# Patient Record
Sex: Male | Born: 1950 | Race: White | Hispanic: No | Marital: Married | Smoking: Former smoker
Health system: Southern US, Community
[De-identification: ages and names within clinical notes are randomized; demographics above are authoritative.]

## PROBLEM LIST (undated history)

## (undated) DIAGNOSIS — I480 Paroxysmal atrial fibrillation: Secondary | ICD-10-CM

## (undated) DIAGNOSIS — Z8619 Personal history of other infectious and parasitic diseases: Secondary | ICD-10-CM

## (undated) DIAGNOSIS — I10 Essential (primary) hypertension: Secondary | ICD-10-CM

## (undated) DIAGNOSIS — E785 Hyperlipidemia, unspecified: Secondary | ICD-10-CM

## (undated) DIAGNOSIS — I255 Ischemic cardiomyopathy: Secondary | ICD-10-CM

## (undated) DIAGNOSIS — I2510 Atherosclerotic heart disease of native coronary artery without angina pectoris: Secondary | ICD-10-CM

## (undated) HISTORY — DX: Hyperlipidemia, unspecified: E78.5

## (undated) HISTORY — PX: CATARACT EXTRACTION: SUR2

## (undated) HISTORY — DX: Morbid (severe) obesity due to excess calories: E66.01

## (undated) HISTORY — DX: Paroxysmal atrial fibrillation: I48.0

## (undated) HISTORY — DX: Essential (primary) hypertension: I10

## (undated) HISTORY — DX: Personal history of other infectious and parasitic diseases: Z86.19

## (undated) HISTORY — PX: OTHER SURGICAL HISTORY: SHX169

## (undated) HISTORY — DX: Ischemic cardiomyopathy: I25.5

## (undated) HISTORY — DX: Atherosclerotic heart disease of native coronary artery without angina pectoris: I25.10

## (undated) HISTORY — PX: FINGER SURGERY: SHX640

## (undated) HISTORY — PX: CORONARY ANGIOPLASTY: SHX604

---

## 2014-06-21 ENCOUNTER — Ambulatory Visit: Payer: Self-pay | Admitting: Unknown Physician Specialty

## 2014-06-21 HISTORY — PX: COLONOSCOPY: SHX174

## 2014-06-22 LAB — PATHOLOGY REPORT

## 2014-08-31 LAB — BASIC METABOLIC PANEL
BUN: 16 mg/dL (ref 4–21)
CREATININE: 1.1 mg/dL (ref 0.6–1.3)
GLUCOSE: 87 mg/dL
POTASSIUM: 4.7 mmol/L (ref 3.4–5.3)
Sodium: 141 mmol/L (ref 137–147)

## 2014-08-31 LAB — TSH: TSH: 1.47 u[IU]/mL (ref 0.41–5.90)

## 2015-10-26 ENCOUNTER — Encounter: Payer: Self-pay | Admitting: Emergency Medicine

## 2015-10-26 ENCOUNTER — Encounter
Admission: EM | Disposition: A | Discharge status: Home / Self Care | Payer: 59 | Source: Home / Self Care | Attending: Cardiovascular Disease

## 2015-10-26 ENCOUNTER — Inpatient Hospital Stay
Admission: EM | Admit: 2015-10-26 | Discharge: 2015-10-29 | DRG: 246 | Disposition: A | Discharge status: Home / Self Care | Payer: 59 | Attending: Cardiovascular Disease | Admitting: Cardiovascular Disease

## 2015-10-26 DIAGNOSIS — I255 Ischemic cardiomyopathy: Secondary | ICD-10-CM | POA: Diagnosis present

## 2015-10-26 DIAGNOSIS — I2119 ST elevation (STEMI) myocardial infarction involving other coronary artery of inferior wall: Principal | ICD-10-CM | POA: Diagnosis present

## 2015-10-26 DIAGNOSIS — I959 Hypotension, unspecified: Secondary | ICD-10-CM | POA: Diagnosis present

## 2015-10-26 DIAGNOSIS — I4891 Unspecified atrial fibrillation: Secondary | ICD-10-CM | POA: Diagnosis present

## 2015-10-26 DIAGNOSIS — I4901 Ventricular fibrillation: Secondary | ICD-10-CM | POA: Diagnosis present

## 2015-10-26 DIAGNOSIS — I2510 Atherosclerotic heart disease of native coronary artery without angina pectoris: Secondary | ICD-10-CM | POA: Diagnosis present

## 2015-10-26 DIAGNOSIS — F17210 Nicotine dependence, cigarettes, uncomplicated: Secondary | ICD-10-CM | POA: Diagnosis present

## 2015-10-26 DIAGNOSIS — I2121 ST elevation (STEMI) myocardial infarction involving left circumflex coronary artery: Secondary | ICD-10-CM

## 2015-10-26 DIAGNOSIS — Z7982 Long term (current) use of aspirin: Secondary | ICD-10-CM | POA: Diagnosis not present

## 2015-10-26 DIAGNOSIS — E8770 Fluid overload, unspecified: Secondary | ICD-10-CM | POA: Diagnosis present

## 2015-10-26 DIAGNOSIS — I2129 ST elevation (STEMI) myocardial infarction involving other sites: Secondary | ICD-10-CM

## 2015-10-26 DIAGNOSIS — E785 Hyperlipidemia, unspecified: Secondary | ICD-10-CM | POA: Diagnosis present

## 2015-10-26 DIAGNOSIS — E878 Other disorders of electrolyte and fluid balance, not elsewhere classified: Secondary | ICD-10-CM | POA: Diagnosis present

## 2015-10-26 DIAGNOSIS — J9690 Respiratory failure, unspecified, unspecified whether with hypoxia or hypercapnia: Secondary | ICD-10-CM

## 2015-10-26 DIAGNOSIS — Z79899 Other long term (current) drug therapy: Secondary | ICD-10-CM | POA: Diagnosis not present

## 2015-10-26 DIAGNOSIS — R079 Chest pain, unspecified: Secondary | ICD-10-CM | POA: Diagnosis not present

## 2015-10-26 DIAGNOSIS — I213 ST elevation (STEMI) myocardial infarction of unspecified site: Secondary | ICD-10-CM | POA: Insufficient documentation

## 2015-10-26 HISTORY — PX: CARDIAC CATHETERIZATION: SHX172

## 2015-10-26 LAB — BASIC METABOLIC PANEL
Anion gap: 9 (ref 5–15)
BUN: 21 mg/dL — AB (ref 6–20)
CHLORIDE: 105 mmol/L (ref 101–111)
CO2: 23 mmol/L (ref 22–32)
Calcium: 9.2 mg/dL (ref 8.9–10.3)
Creatinine, Ser: 0.8 mg/dL (ref 0.61–1.24)
GFR calc Af Amer: >60 mL/min (ref >60)
GFR calc non Af Amer: >60 mL/min (ref >60)
GLUCOSE: 125 mg/dL — AB (ref 65–99)
POTASSIUM: 3.4 mmol/L — AB (ref 3.5–5.1)
Sodium: 137 mmol/L (ref 135–145)

## 2015-10-26 LAB — HEPATIC FUNCTION PANEL
ALK PHOS: 72 U/L (ref 38–126)
ALT: 39 U/L (ref 17–63)
AST: 40 U/L (ref 15–41)
Albumin: 3.8 g/dL (ref 3.5–5.0)
BILIRUBIN TOTAL: 0.6 mg/dL (ref 0.3–1.2)
Total Protein: 6.1 g/dL — ABNORMAL LOW (ref 6.5–8.1)

## 2015-10-26 LAB — CBC
HCT: 42.8 % (ref 40.0–52.0)
HEMOGLOBIN: 15 g/dL (ref 13.0–18.0)
MCH: 31.5 pg (ref 26.0–34.0)
MCHC: 35.1 g/dL (ref 32.0–36.0)
MCV: 89.8 fL (ref 80.0–100.0)
PLATELETS: 247 10*3/uL (ref 150–440)
RBC: 4.77 MIL/uL (ref 4.40–5.90)
RDW: 13.1 % (ref 11.5–14.5)
WBC: 11 10*3/uL — AB (ref 3.8–10.6)

## 2015-10-26 LAB — TROPONIN I: TROPONIN I: 1.08 ng/mL — AB (ref <0.031)

## 2015-10-26 SURGERY — LEFT HEART CATH AND CORONARY ANGIOGRAPHY
Anesthesia: Moderate Sedation

## 2015-10-26 MED ORDER — SODIUM CHLORIDE 0.9 % IV SOLN: 250.0000 mL | INTRAVENOUS | Status: DC | PRN

## 2015-10-26 MED ORDER — METOPROLOL TARTRATE 1 MG/ML IV SOLN
INTRAVENOUS | Status: AC
  Filled 2015-10-26: qty 5

## 2015-10-26 MED ORDER — ROSUVASTATIN CALCIUM 20 MG PO TABS
20.0000 mg | ORAL_TABLET | Freq: Every day | ORAL | Status: DC
  Administered 2015-10-26 – 2015-10-28 (×3): 20 mg via ORAL
  Filled 2015-10-26 (×2): qty 2
  Filled 2015-10-26: qty 1

## 2015-10-26 MED ORDER — MIDAZOLAM HCL 2 MG/2ML IJ SOLN
INTRAMUSCULAR | Status: DC | PRN
  Administered 2015-10-26: 1 mg via INTRAVENOUS

## 2015-10-26 MED ORDER — ENOXAPARIN SODIUM 40 MG/0.4ML ~~LOC~~ SOLN
40.0000 mg | SUBCUTANEOUS | Status: DC
  Administered 2015-10-27 – 2015-10-28 (×2): 40 mg via SUBCUTANEOUS
  Filled 2015-10-26 (×3): qty 0.4

## 2015-10-26 MED ORDER — BIVALIRUDIN 250 MG IV SOLR
INTRAVENOUS | Status: AC
  Filled 2015-10-26: qty 250

## 2015-10-26 MED ORDER — SODIUM CHLORIDE 0.9 % IV SOLN
INTRAVENOUS | Status: DC
  Administered 2015-10-26 – 2015-10-27 (×2): via INTRAVENOUS

## 2015-10-26 MED ORDER — MIDAZOLAM HCL 2 MG/2ML IJ SOLN
INTRAMUSCULAR | Status: AC
  Filled 2015-10-26: qty 2

## 2015-10-26 MED ORDER — ONDANSETRON HCL 4 MG/2ML IJ SOLN: 4.0000 mg | Freq: Four times a day (QID) | INTRAMUSCULAR | Status: DC | PRN

## 2015-10-26 MED ORDER — METOPROLOL TARTRATE 25 MG PO TABS
25.0000 mg | ORAL_TABLET | Freq: Four times a day (QID) | ORAL | Status: DC
  Administered 2015-10-26 – 2015-10-27 (×4): 25 mg via ORAL
  Filled 2015-10-26 (×4): qty 1

## 2015-10-26 MED ORDER — BIVALIRUDIN BOLUS VIA INFUSION - CUPID
INTRAVENOUS | Status: DC | PRN
  Administered 2015-10-26: 85.05 mg via INTRAVENOUS

## 2015-10-26 MED ORDER — BIVALIRUDIN 250 MG IV SOLR
250.0000 mg | INTRAVENOUS | Status: DC | PRN
  Administered 2015-10-26: 1.75 mg/kg/h via INTRAVENOUS

## 2015-10-26 MED ORDER — FENTANYL CITRATE (PF) 100 MCG/2ML IJ SOLN
INTRAMUSCULAR | Status: AC
  Filled 2015-10-26: qty 2

## 2015-10-26 MED ORDER — METOPROLOL TARTRATE 1 MG/ML IV SOLN: 2.5000 mg | Freq: Four times a day (QID) | INTRAVENOUS | Status: DC | PRN

## 2015-10-26 MED ORDER — NITROGLYCERIN 5 MG/ML IV SOLN
INTRAVENOUS | Status: AC
  Filled 2015-10-26: qty 10

## 2015-10-26 MED ORDER — METOPROLOL TARTRATE 1 MG/ML IV SOLN
5.0000 mg | Freq: Once | INTRAVENOUS | Status: AC
  Administered 2015-10-26: 5 mg via INTRAVENOUS
  Filled 2015-10-26: qty 5

## 2015-10-26 MED ORDER — ASPIRIN EC 81 MG PO TBEC
81.0000 mg | DELAYED_RELEASE_TABLET | Freq: Every day | ORAL | Status: DC
  Administered 2015-10-27 – 2015-10-29 (×3): 81 mg via ORAL
  Filled 2015-10-26 (×3): qty 1

## 2015-10-26 MED ORDER — LISINOPRIL 5 MG PO TABS
5.0000 mg | ORAL_TABLET | Freq: Every day | ORAL | Status: DC
  Administered 2015-10-26 – 2015-10-29 (×4): 5 mg via ORAL
  Filled 2015-10-26 (×4): qty 1
  Filled 2015-10-26: qty 2

## 2015-10-26 MED ORDER — TICAGRELOR 90 MG PO TABS
ORAL_TABLET | ORAL | Status: DC | PRN
  Administered 2015-10-26: 180 mg via ORAL

## 2015-10-26 MED ORDER — TICAGRELOR 90 MG PO TABS
90.0000 mg | ORAL_TABLET | Freq: Two times a day (BID) | ORAL | Status: DC
  Administered 2015-10-26 – 2015-10-29 (×6): 90 mg via ORAL
  Filled 2015-10-26 (×6): qty 1

## 2015-10-26 MED ORDER — FENTANYL CITRATE (PF) 100 MCG/2ML IJ SOLN
INTRAMUSCULAR | Status: DC | PRN
  Administered 2015-10-26: 50 ug via INTRAVENOUS

## 2015-10-26 MED ORDER — IOHEXOL 300 MG/ML  SOLN
INTRAMUSCULAR | Status: DC | PRN
  Administered 2015-10-26: 230 mL via INTRA_ARTERIAL

## 2015-10-26 MED ORDER — TICAGRELOR 90 MG PO TABS
ORAL_TABLET | ORAL | Status: AC
  Filled 2015-10-26: qty 2

## 2015-10-26 MED ORDER — METOPROLOL TARTRATE 1 MG/ML IV SOLN
INTRAVENOUS | Status: DC | PRN
  Administered 2015-10-26: 5 mg via INTRAVENOUS

## 2015-10-26 MED ORDER — MAGNESIUM SULFATE 50 % IJ SOLN
INTRAMUSCULAR | Status: AC | PRN
  Administered 2015-10-26: 2 g via INTRAVENOUS

## 2015-10-26 MED ORDER — SODIUM CHLORIDE 0.9 % IJ SOLN: 3.0000 mL | INTRAMUSCULAR | Status: DC | PRN

## 2015-10-26 MED ORDER — HYDROCODONE-ACETAMINOPHEN 5-325 MG PO TABS
1.0000 | ORAL_TABLET | Freq: Once | ORAL | Status: AC
  Administered 2015-10-27: 1 via ORAL
  Filled 2015-10-26: qty 1

## 2015-10-26 MED ORDER — SODIUM CHLORIDE 0.9 % IJ SOLN
3.0000 mL | Freq: Two times a day (BID) | INTRAMUSCULAR | Status: DC
  Administered 2015-10-26 – 2015-10-28 (×4): 3 mL via INTRAVENOUS

## 2015-10-26 MED ORDER — NITROGLYCERIN 1 MG/10 ML FOR IR/CATH LAB
INTRA_ARTERIAL | Status: DC | PRN
  Administered 2015-10-26: 300 ug via INTRACORONARY

## 2015-10-26 MED ORDER — HEPARIN (PORCINE) IN NACL 2-0.9 UNIT/ML-% IJ SOLN
INTRAMUSCULAR | Status: AC
  Filled 2015-10-26: qty 1000

## 2015-10-26 MED ORDER — ACETAMINOPHEN 325 MG PO TABS
650.0000 mg | ORAL_TABLET | ORAL | Status: DC | PRN
  Administered 2015-10-26 – 2015-10-27 (×2): 650 mg via ORAL
  Filled 2015-10-26 (×2): qty 2

## 2015-10-26 MED ORDER — CALCIUM CHLORIDE 10 % IV SOLN
INTRAVENOUS | Status: AC | PRN
Start: 2015-10-26 — End: 2015-10-26
  Administered 2015-10-26: 1 g via INTRAVENOUS

## 2015-10-26 MED ORDER — POTASSIUM CHLORIDE CRYS ER 20 MEQ PO TBCR
40.0000 meq | EXTENDED_RELEASE_TABLET | Freq: Once | ORAL | Status: AC
  Administered 2015-10-26: 40 meq via ORAL
  Filled 2015-10-26: qty 2

## 2015-10-26 MED ORDER — METOPROLOL TARTRATE 1 MG/ML IV SOLN
INTRAVENOUS | Status: DC | PRN
  Administered 2015-10-26: 2.5 mg via INTRAVENOUS

## 2015-10-26 MED ORDER — MAGNESIUM SULFATE 2 GM/50ML IV SOLN
INTRAVENOUS | Status: AC
  Administered 2015-10-26: 16:00:00
  Filled 2015-10-26: qty 50

## 2015-10-26 SURGICAL SUPPLY — 17 items
BALLN TREK RX 2.5X12 (BALLOONS) ×3
BALLOON TREK RX 2.5X12 (BALLOONS) ×1 IMPLANT
CATH INFINITI 5FR ANG PIGTAIL (CATHETERS) ×3 IMPLANT
CATH INFINITI 5FR JL4 (CATHETERS) ×3 IMPLANT
CATH INFINITI JR4 5F (CATHETERS) IMPLANT
CATH OPTITORQUE JACKY 4.0 5F (CATHETERS) IMPLANT
CATH VISTA GUIDE 6FR JR4 (CATHETERS) ×3 IMPLANT
CATH VISTA GUIDE 6FR XB3.5 (CATHETERS) ×3 IMPLANT
DEVICE CLOSURE MYNXGRIP 6/7F (Vascular Products) ×3 IMPLANT
GLIDESHEATH SLEND SS 6F .021 (SHEATH) IMPLANT
KIT MANI 3VAL PERCEP (MISCELLANEOUS) ×3 IMPLANT
NEEDLE PERC 18GX7CM (NEEDLE) ×3 IMPLANT
PACK CARDIAC CATH (CUSTOM PROCEDURE TRAY) ×3 IMPLANT
SHEATH PINNACLE 6F 10CM (SHEATH) ×3 IMPLANT
STENT XIENCE ALPINE RX 3.0X15 (Permanent Stent) ×3 IMPLANT
WIRE RUNTHROUGH .014X180CM (WIRE) ×3 IMPLANT
WIRE SAFE-T 1.5MM-J .035X260CM (WIRE) IMPLANT

## 2015-10-26 NOTE — ED Provider Notes (Signed)
[facility] Emergency Department Provider Note  ____________________________________________  Time seen: Approximately 3:20 PM  I have reviewed the triage vital signs and the nursing notes.   HISTORY  Chief Complaint STEMI     HPI Gilbert Coleman is a 64 y.o. male with no significant past medical history other than tobacco use who presents by EMS as a code STEMI.  He reports that he had some left-sided chest pain, shortness breath, and left arm pain at approximately 10:00 this morning but it got better after he sat down and took a few deep breaths.  He was doing some work outside this afternoon when he had acute onset of severe crushing chest pain, shortness of breath, and lightheadedness.  Nothing made it better nothing made it worse.  He called EMS and when they arrived they found that he had a STEMI of the inferior wall based on their EKG.  They were transporting him to [facility] when he had a V. fib arrest at 15:17.  He was defibrillated once and immediately regained consciousness and began speaking again.  He did get a full dose aspirin prior to arrival.  Arrived to our emergency Department at approximately 1520 and was awake and alert on a nonrebreather 100% oxygen and complaining of mild chest pain but eating that it had improved.  However while we were getting him settled in he said "I feel coming on again" and then again went into V. fib arrest.  Please refer to Hospital course below for the remainder of the events.   History reviewed. No pertinent past medical history.  There are no active problems to display for this patient.   History reviewed. No pertinent past surgical history.  No current outpatient prescriptions on file.  Allergies Atorvastatin  History reviewed. No pertinent family history.  Social History Social History  Substance Use Topics  . Smoking status: Current Some Day Smoker  . Smokeless tobacco: None  . Alcohol Use: None     Review of Systems Constitutional: No fever/chills Eyes: No visual changes. ENT: No sore throat. Cardiovascular: Severe sharp stabbing crushing chest pain Respiratory: Severe shortness of breath Gastrointestinal: No abdominal pain.  No nausea, no vomiting.  No diarrhea.  No constipation. Genitourinary: Negative for dysuria. Musculoskeletal: Negative for back pain. Skin: Negative for rash. Neurological: Negative for headaches, focal weakness or numbness.  10-point ROS otherwise negative.  ____________________________________________   PHYSICAL EXAM:  VITAL SIGNS: ED Triage Vitals  Enc Vitals Group     BP 10/26/15 1529 0/0 mmHg     Pulse Rate 10/26/15 1529 149     Resp 10/26/15 1530 15     Temp 10/26/15 1534 97.8 F (36.6 C)     Temp Source 10/26/15 1534 Oral     SpO2 10/26/15 1530 96 %     Weight --      Height --      Head Cir --      Peak Flow --      Pain Score --      Pain Loc --      Pain Edu? --      Excl. in [place]? --     Constitutional: Alert and oriented on arrival, ill-appearing Eyes: Conjunctivae are normal. PERRL. EOMI. Head: Atraumatic. Nose: No congestion/rhinnorhea. Mouth/Throat: Mucous membranes are moist.  Oropharynx non-erythematous. Neck: No stridor.   Cardiovascular: Tachycardia, regular rhythm. Grossly normal heart sounds.  Good peripheral circulation. Respiratory: Normal respiratory effort.  No retractions. Lungs CTAB. Gastrointestinal:  Soft and nontender. No distention. No abdominal bruits. No CVA tenderness. Musculoskeletal: No lower extremity tenderness nor edema.  No joint effusions. Neurologic:  Normal speech and language. No gross focal neurologic deficits are appreciated.  Skin:  Skin is warm, dry and intact. No rash noted. Psychiatric: Mood and affect are normal. Speech and behavior are normal.  ____________________________________________   LABS (all labs ordered are listed, but only abnormal results are displayed)  Labs  Reviewed - No data to display ____________________________________________  EKG  ED ECG REPORT I, Ruhama Lehew, the attending physician, personally viewed and interpreted this ECG.   Date: 10/26/2015  EKG Time: 1527  Rate: 136  Rhythm: ST elevation in inferior leads with reciprocal depression in septal and anterior leads, a easily meets criteria for STEMI  Axis: Normal  Intervals:Normal  ST&T Change: Large inferior wall and lateral wall STEMI  ____________________________________________  RADIOLOGY   No results found.  ____________________________________________   PROCEDURES  Procedure(s) performed: None  Critical Care performed: Yes, see critical care note(s)   CRITICAL CARE Performed by: Loleta Rose   Total critical care time: 30 minutes  Critical care time was exclusive of separately billable procedures and treating other patients.  Critical care was necessary to treat or prevent imminent or life-threatening deterioration.  Critical care was time spent personally by me on the following activities: development of treatment plan with patient and/or surrogate as well as nursing, discussions with consultants, evaluation of patient's response to treatment, examination of patient, obtaining history from patient or surrogate, ordering and performing treatments and interventions, ordering and review of laboratory studies, ordering and review of radiographic studies, pulse oximetry and re-evaluation of patient's condition.  ____________________________________________   INITIAL IMPRESSION / ASSESSMENT AND PLAN / ED COURSE  Pertinent labs & imaging results that were available during my care of the patient were reviewed by me and considered in my medical decision making (see chart for details).  As stated above, the patient arrived awake and alert and as we are getting him settled in to his exam room he went into V. fib arrest again.  We placed anterior and posterior  chest pads and I defibrillated him at 200 J.  He immediately went back to a sinus tachycardia on the monitor and regained consciousness and was again awake and alert.  My colleague was in the process of contacting the on-call cardiologist, Dr. Kirke Corin, while I was stabilizing the patient, and Dr. Kirke Corin arrived within 2 minutes to the emergency department.  As Dr. [place] Sink arrives the patient again went into V. fib arrest.  This pattern repeated for a total of 4 defibrillations before he stayed in a stable sinus tachycardia.  At no point were chest compressions necessary.  In the emergency department the patient received heparin 4000 units IV bolus, calcium chloride 1 g IV, magnesium 2 g IV, and he received a full dose aspirin prior to arrival.  He was again awake and alert when Dr. Kirke Corin took him to the catheter lab.  EKG documented was from EMS, it was clear and met criteria for STEMI and we did not have the opportunity to obtain another EKG in the emergency department due to his frequent V. fib arrests and the need to get him to the catheter lab as soon as possible.  Dr. Kirke Corin reviewed the rhythm strips and 12-lead from EMS and agreed with the diagnosis. ____________________________________________  FINAL CLINICAL IMPRESSION(S) / ED DIAGNOSES  Final diagnoses:  Acute ST elevation myocardial infarction (STEMI) involving other  coronary artery of inferior wall (HCC)  Acute ST elevation myocardial infarction (STEMI) of lateral wall [facility])      NEW MEDICATIONS STARTED DURING THIS VISIT:  Current Discharge Medication List       Hinda Kehr, MD 10/26/15 [phone]

## 2015-10-26 NOTE — ED Notes (Signed)
Patient arrived to this facility alert and oriented, pulses present following a vfib arrest with EMS that occurred at 1517.  When patient arrived, he had another vfib arrest and was immediately defibrillated successfully with ROSC.  Patient was given 2 grams magnesium, 1 gram calcium chloride and arrested a 2nd, 3rd, and 4th times, each time successfully converting a vfib rhythm to sinus tach with obvious ST segment elevation in inferior leads. Dr. Kary KosArita arrived, pt was given 4000 units of heparin and transported immediately to cath lab with cardiology, 2 nurses, and 1 paramedic with him while connected to monitor and defibrillator.  When he was transferred to cath lab, he was alert and oriented, talking.

## 2015-10-26 NOTE — ED Notes (Signed)
4000 units heparin given 

## 2015-10-26 NOTE — H&P (Signed)
History and Physical  Patient ID: Gilbert Coleman MRN: [id number] DOB/AGE: 1951-03-01 64 y.o. Admit date: 10/26/2015  Primary Care Physician: No primary care provider on file. Primary Cardiologist new   Chief complaint: Chest pain.  HPI: This is a 64 year old male with no previous cardiac history. He has known history of hyperlipidemia but no history of hypertension or diabetes. He smokes very rarely not on a regular basis. He presented with acute onset of chest pain. This started at 10:30 in the morning while he was shopping. He rested and the chest pain disappeared. He went back home and around 2:30, he started having severe chest pain radiating to his left arm associated with extreme weakness, dizziness and shortness of breath. He did not have nausea or vomiting. He called EMS. EKG in the field showed inferolateral ST elevation. While he was being transported, he developed ventricular fibrillation and was shocked once into sinus tachycardia. He was brought to the emergency room with active chest pain and recurrent ventricular fibrillation. He required defibrillation at least 4 times. He was given 4000 units of unfractionated heparin in addition to aspirin and was transferred emergently for cardiac catheterization.  A 10 point Review of systems complete and found to be negative unless listed above   History reviewed. No pertinent past medical history.  History reviewed.   Family history: He has known family history of coronary artery disease. His brothers had stent placement. His father had valve surgery.  Social History   Social History  . Marital Status: Married    Spouse Name: N/A  . Number of Children: N/A  . Years of Education: N/A   Occupational History  . Not on file.   Social History Main Topics  . Smoking status: Current Some Day Smoker  . Smokeless tobacco: Not on file  . Alcohol Use: Not on file  . Drug Use: Not on file  . Sexual Activity: Not on file   Other  Topics Concern  . Not on file   Social History Narrative  . No narrative on file    History reviewed. No pertinent past surgical history.   Prescriptions prior to admission  Medication Sig Dispense Refill Last Dose  . aspirin 81 MG tablet Take 1 tablet by mouth daily.     . Misc Natural Products (GLUCOSAMINE CHONDROITIN COMPLX) TABS Take 1 tablet by mouth daily.     . Multiple Vitamin (MULTIVITAMIN WITH MINERALS) TABS tablet Take 1 tablet by mouth daily.     Marland Kitchen omega-3 fish oil (MAXEPA) 1000 MG CAPS capsule Take 1 capsule by mouth 2 (two) times daily.       Physical Exam: Blood pressure 119/79, pulse 87, temperature 97.8 F (36.6 C), temperature source Oral, resp. rate 14, SpO2 97 %.   Constitutional: He is oriented to person, place, and time. He appears well-developed and well-nourished. No distress.  HENT: No nasal discharge.  Head: Normocephalic and atraumatic.  Eyes: Pupils are equal and round.  No discharge. Neck: Normal range of motion. Neck supple. No JVD present. No thyromegaly present.  Cardiovascular: Normal rate, regular rhythm, normal heart sounds. Exam reveals no gallop and no friction rub. No murmur heard.  Pulmonary/Chest: Effort normal and breath sounds normal. No stridor. No respiratory distress. He has no wheezes. He has no rales. He exhibits no tenderness.  Abdominal: Soft. Bowel sounds are normal. He exhibits no distension. There is no tenderness. There is no rebound and no guarding.  Musculoskeletal: Normal range of motion. He exhibits  no edema and no tenderness.  Neurological: He is alert and oriented to person, place, and time. Coordination normal.  Skin: Skin is warm and dry. No rash noted. He is not diaphoretic. No erythema. No pallor.  Psychiatric: He has a normal mood and affect. His behavior is normal. Judgment and thought content normal.      Labs:   Lab Results  Component Value Date   WBC 11.0* 10/26/2015   HGB 15.0 10/26/2015   HCT 42.8  10/26/2015   MCV 89.8 10/26/2015   PLT 247 10/26/2015   No results for input(s): NA, K, CL, CO2, BUN, CREATININE, CALCIUM, PROT, BILITOT, ALKPHOS, ALT, AST, GLUCOSE in the last 168 hours.  Invalid input(s): LABALBU No results found for: CKTOTAL, CKMB, CKMBINDEX, TROPONINI No results found for: CHOL No results found for: HDL No results found for: LDLCALC No results found for: TRIG No results found for: CHOLHDL No results found for: LDLDIRECT    Radiology: No results found.  EKG:  EKG was independently reviewed by me and showed   inferolateral ST elevation with reciprocal anterior ST depression and sinus tachycardia.  ASSESSMENT AND PLAN:  1. Acute inferolateral ST elevation myocardial infarction: The patient was given aspirin and heparin and was taken emergently for cardiac catheterization after I discussed the procedure briefly with him. This was an emergent procedure and the patient was actively having ventricular fibrillation at that time of initial evaluation. Further recommendations to follow after cardiac catheterization.  2. Ventricular fibrillation: Likely due to coronary ischemia. The patient was given magnesium. We'll start a beta blocker. Check ejection fraction.  3. Hyperlipidemia: Start high-dose statin.  4. Rare tobacco use: Smoking cessation is advised.   Signed: Lorine BearsMuhammad Arida MD, [facility] 10/26/2015, 5:02 PM

## 2015-10-26 NOTE — ED Notes (Signed)
Transported to cath lab with Dr. Isaias SakaiArita, Amanda, RN; This nurse, and a nurse tech.

## 2015-10-26 NOTE — Progress Notes (Signed)
Pt is stable at this time. Site has minimal drainage, which is marked. Pt is alert and oriented x4, wife is at bedside. Report given to oncoming night nurse.

## 2015-10-26 NOTE — Progress Notes (Signed)
Very nice couple.  Gave compassionate presence and emotional support. Chaplain Ronney Liononna S Rishith Siddoway Ext [phone]

## 2015-10-26 NOTE — Progress Notes (Signed)
eLink Physician-Brief Progress Note Patient Name: Gilbert CrawfordRobert D Coleman DOB: 03/28/1951 MRN: [id number]   Date of Service  10/26/2015  HPI/Events of Note   bedside nurse reports patient having chest wall pain 2/10. On repeat questioning he may be experiencing some chest "pressure" but still reports this is different from his prior MI from earlier. Additionally the patient has been mildly hypertensive. Of note the patient did undergo defibrillation earlier today.   eICU Interventions  1.  metoprolol 2.5 mg IV every 6 hours when necessary for hypertension  2. Lortab 5/325 one tab now.  3. Nurse to notify for any persistent or worsening chest pain.      Intervention Category Intermediate Interventions: Other:  Lawanda CousinsJennings Nestor 10/26/2015, 11:27 PM

## 2015-10-26 NOTE — ED Notes (Signed)
Patient received 2 gram magnesium, 1 gram calcium chloride, 4000 units heparin while here.

## 2015-10-26 NOTE — ED Notes (Signed)
Per EMS:  STEMI:  cp 15 minutes' inferior stemi road to [place] went into vfib, defibrillated and had rosc. arrested at 1519.  now alert and oriented.  1 ntg, 324 asa, 20 right hand.  160/palpated.    Natalie Luberta Robertsondaniel Bicknell  1523 MD at bedside  currently cp rated as "better now, im talking to you" smoker, no prev cardiac hx   EMS Vital Signs: 160/98, pale diaphoretic, 90% ra, 105 ST elevation inferior leads,   1525 vfib arrested, agonal respirations.   1526 defibrillating. ST now with ST seg elevation

## 2015-10-26 NOTE — ED Notes (Signed)
First arrest at 1517, defibrillated once with ems.  4 times here. Currently ST pulses present.

## 2015-10-27 ENCOUNTER — Inpatient Hospital Stay: Payer: 59

## 2015-10-27 ENCOUNTER — Inpatient Hospital Stay (HOSPITAL_COMMUNITY)
Admit: 2015-10-27 | Discharge: 2015-10-27 | Disposition: A | Discharge status: Home / Self Care | Payer: 59 | Attending: Cardiovascular Disease | Admitting: Cardiovascular Disease

## 2015-10-27 DIAGNOSIS — I2119 ST elevation (STEMI) myocardial infarction involving other coronary artery of inferior wall: Principal | ICD-10-CM

## 2015-10-27 DIAGNOSIS — R079 Chest pain, unspecified: Secondary | ICD-10-CM

## 2015-10-27 LAB — CBC
HCT: 49.2 % (ref 40.0–52.0)
Hemoglobin: 17 g/dL (ref 13.0–18.0)
MCH: 31.6 pg (ref 26.0–34.0)
MCHC: 34.5 g/dL (ref 32.0–36.0)
MCV: 91.7 fL (ref 80.0–100.0)
PLATELETS: 296 10*3/uL (ref 150–440)
RBC: 5.36 MIL/uL (ref 4.40–5.90)
RDW: 13.4 % (ref 11.5–14.5)
WBC: 19.8 10*3/uL — ABNORMAL HIGH (ref 3.8–10.6)

## 2015-10-27 LAB — BASIC METABOLIC PANEL
Anion gap: 7 (ref 5–15)
BUN: 19 mg/dL (ref 6–20)
CHLORIDE: 106 mmol/L (ref 101–111)
CO2: 25 mmol/L (ref 22–32)
CREATININE: 0.79 mg/dL (ref 0.61–1.24)
Calcium: 9.1 mg/dL (ref 8.9–10.3)
GFR calc non Af Amer: >60 mL/min (ref >60)
GLUCOSE: 149 mg/dL — AB (ref 65–99)
Potassium: 4.3 mmol/L (ref 3.5–5.1)
Sodium: 138 mmol/L (ref 135–145)

## 2015-10-27 LAB — POTASSIUM: Potassium: 3.7 mmol/L (ref 3.5–5.1)

## 2015-10-27 LAB — TROPONIN I: Troponin I: 22.18 ng/mL — ABNORMAL HIGH (ref <0.031)

## 2015-10-27 LAB — GLUCOSE, CAPILLARY: Glucose-Capillary: 160 mg/dL — ABNORMAL HIGH (ref 65–99)

## 2015-10-27 MED ORDER — FUROSEMIDE 10 MG/ML IJ SOLN
INTRAMUSCULAR | Status: AC
  Administered 2015-10-27: 40 mg via INTRAVENOUS
  Filled 2015-10-27: qty 4

## 2015-10-27 MED ORDER — ALPRAZOLAM 0.25 MG PO TABS
0.2500 mg | ORAL_TABLET | Freq: Once | ORAL | Status: AC
  Administered 2015-10-27: 0.25 mg via ORAL
  Filled 2015-10-27: qty 1

## 2015-10-27 MED ORDER — FUROSEMIDE 10 MG/ML IJ SOLN
40.0000 mg | Freq: Once | INTRAMUSCULAR | Status: AC
  Administered 2015-10-27: 40 mg via INTRAVENOUS

## 2015-10-27 MED ORDER — ALPRAZOLAM 0.25 MG PO TABS: 0.2500 mg | ORAL_TABLET | Freq: Three times a day (TID) | ORAL | Status: DC | PRN

## 2015-10-27 MED ORDER — NITROGLYCERIN 0.4 MG SL SUBL
0.4000 mg | SUBLINGUAL_TABLET | Freq: Once | SUBLINGUAL | Status: AC
  Administered 2015-10-27: 0.4 mg via SUBLINGUAL
  Filled 2015-10-27: qty 1

## 2015-10-27 MED ORDER — AMIODARONE HCL IN DEXTROSE 360-4.14 MG/200ML-% IV SOLN
30.0000 mg/h | INTRAVENOUS | Status: DC
  Administered 2015-10-27 – 2015-10-28 (×2): 30 mg/h via INTRAVENOUS
  Filled 2015-10-27 (×4): qty 200

## 2015-10-27 MED ORDER — AMIODARONE LOAD VIA INFUSION
150.0000 mg | Freq: Once | INTRAVENOUS | Status: AC
  Administered 2015-10-27: 150 mg via INTRAVENOUS
  Filled 2015-10-27: qty 83.34

## 2015-10-27 MED ORDER — AMIODARONE HCL IN DEXTROSE 360-4.14 MG/200ML-% IV SOLN
60.0000 mg/h | INTRAVENOUS | Status: AC
  Administered 2015-10-27 (×2): 60 mg/h via INTRAVENOUS
  Filled 2015-10-27 (×2): qty 200

## 2015-10-27 NOTE — Progress Notes (Signed)
eLink Physician-Brief Progress Note Patient Name: Gilbert CrawfordRobert D Coleman DOB: 07/29/1951 MRN: [id number]   Date of Service  10/27/2015  HPI/Events of Note  RN reports continuing chest discomfort & dyspnea unable to lay flat. Vitals and Saturation stable.  eICU Interventions  SL Nitro x1 now.     Intervention Category Intermediate Interventions: Other:  Lawanda CousinsJennings Euleta Belson 10/27/2015, 1:49 AM

## 2015-10-27 NOTE — Progress Notes (Signed)
Spoke with Dr. Kirke [name], patient has had SOB and pressure during night, received Lortab, SL Nitro, Tylenol and .25 Xanax. Currently is having Echo. Afib110s, SBP 120.  Per Dr Kirke [name], initiate Amiodarone drip at 1 mg, maintaining at 1 mg, do not titrate at this time, with 150 mg Bolus. Heart healthy diet.

## 2015-10-27 NOTE — Progress Notes (Signed)
SUBJECTIVE:  The patient continued to be in atrial fibrillation with moderate tachycardia overnight. He did not sleep all night and he has been complaining of increased shortness of breath. Minimal chest pain at the present time. He seems to be extremely anxious.   Filed Vitals:   10/27/15 0700 10/27/15 0800 10/27/15 0900 10/27/15 1000  BP: 130/100 129/88 136/98 120/77  Pulse: 117 119 116 111  Temp:  98.6 F (37 C)    TempSrc:  Axillary    Resp: 19 14 22 18   Height:      Weight:      SpO2: 98% 96% 97% 94%    Intake/Output Summary (Last 24 hours) at 10/27/15 1042 Last data filed at 10/27/15 0800  Gross per 24 hour  Intake   2058 ml  Output   1175 ml  Net    883 ml    LABS: Basic Metabolic Panel:  Recent Labs  52/84/1312/23/16 1523 10/27/15 0426  NA 137 138  K 3.4* 4.3  CL 105 106  CO2 23 25  GLUCOSE 125* 149*  BUN 21* 19  CREATININE 0.80 0.79  CALCIUM 9.2 9.1   Liver Function Tests:  Recent Labs  10/26/15 1523  AST 40  ALT 39  ALKPHOS 72  BILITOT 0.6  PROT 6.1*  ALBUMIN 3.8   No results for input(s): LIPASE, AMYLASE in the last 72 hours. CBC:  Recent Labs  10/26/15 1523 10/27/15 0426  WBC 11.0* 19.8*  HGB 15.0 17.0  HCT 42.8 49.2  MCV 89.8 91.7  PLT 247 296   Cardiac Enzymes:  Recent Labs  10/26/15 1523 10/27/15 0426  TROPONINI 1.08* 22.18*   BNP: Invalid input(s): POCBNP D-Dimer: No results for input(s): DDIMER in the last 72 hours. Hemoglobin A1C: No results for input(s): HGBA1C in the last 72 hours. Fasting Lipid Panel: No results for input(s): CHOL, HDL, LDLCALC, TRIG, CHOLHDL, LDLDIRECT in the last 72 hours. Thyroid Function Tests: No results for input(s): TSH, T4TOTAL, T3FREE, THYROIDAB in the last 72 hours.  Invalid input(s): FREET3 Anemia Panel: No results for input(s): VITAMINB12, FOLATE, FERRITIN, TIBC, IRON, RETICCTPCT in the last 72 hours.   PHYSICAL EXAM General: Well developed, well nourished, in no acute  distress HEENT:  Normocephalic and atramatic Neck:  No JVD.  Lungs: Diminished breath sounds at the base. Heart: Irregularly irregular and mildly tachycardic. No murmurs by exam. Abdomen: Bowel sounds are positive, abdomen soft and non-tender  Msk:  Back normal, normal gait. Normal strength and tone for age. Extremities: No clubbing, cyanosis or edema.   Neuro: Alert and oriented X 3. Psych:  Good affect, responds appropriately  TELEMETRY: Reviewed telemetry pt in  atrial fibrillation with rapid ventricular response. Minimal inferior ST elevation when compared to his presenting EKG.  Chest x-ray independently reviewed by me and showed no evidence of infiltrates or pulmonary edema.  ASSESSMENT AND PLAN:  1. Acute inferolateral ST elevation myocardial infarction: No evidence of recurrent ischemia. Continue dual antiplatelet therapy, metoprolol and a high-dose statin.  2. Ventricular fibrillation: Likely due to coronary ischemia. Continue to replace electrolytes as needed. Continue metoprolol.   3. Acute systolic heart failure due to ischemic cardiomyopathy: The patient is having increased shortness of breath with diminished breath sounds at the base. He seems to be mildly fluid overloaded. I discontinued normal saline. I gave 1 dose of furosemide 40 mg intravenously. Continue to monitor closely.  4. Atrial fibrillation with rapid ventricular response: This is likely contributing to his shortness of breath  and thus I decided to add amiodarone drip to try to convert him back to sinus rhythm.  5. Rare tobacco use: Smoking cessation is advised.  Lorine Bears, MD, [facility] 10/27/2015 10:42 AM

## 2015-10-27 NOTE — Progress Notes (Signed)
eLink Physician-Brief Progress Note Patient Name: Gilbert CrawfordRobert D Coleman DOB: 03/20/1951 MRN: [id number]   Date of Service  10/27/2015  HPI/Events of Note  RN reports continued worsening of dyspnea Despite Xanax, Lortab, & SL Nitro.   eICU Interventions  Checking EKG & portable CXR stat.      Intervention Category Intermediate Interventions: Other:  Lawanda CousinsJennings Tarrence Enck 10/27/2015, 5:26 AM

## 2015-10-27 NOTE — Progress Notes (Signed)
MD order for 40 mg IV Lasix, order entered.

## 2015-10-27 NOTE — Progress Notes (Signed)
Page Dr. Kirke [name] again, patient still complaining of SOB. Amiodarone infusing at 33.6 ml, bolus completed.

## 2015-10-27 NOTE — Progress Notes (Signed)
   10/27/15 0800  Clinical Encounter Type  Visited With Patient;Family  Visit Type Follow-up  Referral From Chaplain  Consult/Referral To Chaplain  Spiritual Encounters  Spiritual Needs Emotional  Stress Factors  Patient Stress Factors Health changes  Family Stress Factors Health changes  Met w/patient to provide emotional support and reassurance with presence. Pt. exhibited anxiety and resistance to sleep. Met w/spouse separately to offer emotional support.  Chap. Ante Arredondo G. Brilliant

## 2015-10-27 NOTE — Progress Notes (Signed)
Patient converted to NSR, 70s. Page Dr. Kirke [name] with above, MD order to change Amiodarone drip to .5 mg/min (30 mg/hr) at this time, and continue to monitor. Patient has diuresed well (2L), resting in bed, no longer c/o of SOB or DOE.

## 2015-10-27 NOTE — Progress Notes (Signed)
Pt. BP @ 2200: 87/48 with MAP: 61 Pt. Alert and orientated, resting in room Spoke with E-link Dr. Craige CottaSood and will continue to monitor pt. Closely, no new orders at this time.

## 2015-10-28 LAB — CBC
HEMATOCRIT: 42.4 % (ref 40.0–52.0)
Hemoglobin: 14.9 g/dL (ref 13.0–18.0)
MCH: 31.8 pg (ref 26.0–34.0)
MCHC: 35.1 g/dL (ref 32.0–36.0)
MCV: 90.6 fL (ref 80.0–100.0)
PLATELETS: 207 10*3/uL (ref 150–440)
RBC: 4.68 MIL/uL (ref 4.40–5.90)
RDW: 13.1 % (ref 11.5–14.5)
WBC: 15.6 10*3/uL — ABNORMAL HIGH (ref 3.8–10.6)

## 2015-10-28 LAB — BASIC METABOLIC PANEL
ANION GAP: 7 (ref 5–15)
BUN: 19 mg/dL (ref 6–20)
CALCIUM: 8.5 mg/dL — AB (ref 8.9–10.3)
CO2: 25 mmol/L (ref 22–32)
Chloride: 107 mmol/L (ref 101–111)
Creatinine, Ser: 0.93 mg/dL (ref 0.61–1.24)
GFR calc Af Amer: >60 mL/min (ref >60)
Glucose, Bld: 118 mg/dL — ABNORMAL HIGH (ref 65–99)
POTASSIUM: 3.4 mmol/L — AB (ref 3.5–5.1)
SODIUM: 139 mmol/L (ref 135–145)

## 2015-10-28 LAB — LIPID PANEL
CHOL/HDL RATIO: 6.5 ratio
CHOLESTEROL: 157 mg/dL (ref 0–200)
HDL: 24 mg/dL — AB (ref >40)
LDL Cholesterol: 79 mg/dL (ref 0–99)
Triglycerides: 269 mg/dL — ABNORMAL HIGH (ref <150)
VLDL: 54 mg/dL — ABNORMAL HIGH (ref 0–40)

## 2015-10-28 LAB — MAGNESIUM: Magnesium: 1.9 mg/dL (ref 1.7–2.4)

## 2015-10-28 LAB — PHOSPHORUS: Phosphorus: 2.6 mg/dL (ref 2.5–4.6)

## 2015-10-28 MED ORDER — POTASSIUM CHLORIDE CRYS ER 20 MEQ PO TBCR
40.0000 meq | EXTENDED_RELEASE_TABLET | Freq: Once | ORAL | Status: AC
  Administered 2015-10-28: 40 meq via ORAL
  Filled 2015-10-28: qty 2

## 2015-10-28 MED ORDER — AMIODARONE HCL 200 MG PO TABS
200.0000 mg | ORAL_TABLET | Freq: Two times a day (BID) | ORAL | Status: DC
  Administered 2015-10-28 – 2015-10-29 (×3): 200 mg via ORAL
  Filled 2015-10-28 (×3): qty 1

## 2015-10-28 MED ORDER — METOPROLOL TARTRATE 25 MG PO TABS
25.0000 mg | ORAL_TABLET | Freq: Two times a day (BID) | ORAL | Status: DC
  Administered 2015-10-28 – 2015-10-29 (×2): 25 mg via ORAL
  Filled 2015-10-28 (×3): qty 1

## 2015-10-28 MED ORDER — AMIODARONE HCL 200 MG PO TABS
200.0000 mg | ORAL_TABLET | Freq: Two times a day (BID) | ORAL | Status: DC
Start: 2015-10-29 — End: 2015-10-29

## 2015-10-28 MED ORDER — METOPROLOL TARTRATE 25 MG PO TABS
12.5000 mg | ORAL_TABLET | Freq: Four times a day (QID) | ORAL | Status: DC
  Administered 2015-10-28: 12.5 mg via ORAL
  Filled 2015-10-28: qty 1

## 2015-10-28 NOTE — Progress Notes (Signed)
Called MD Ramaswamy about pt.'s BP of 88/53 as pt. Had lopressor scheduled and no parameters on medication. MD ordered lower dose of lopressor to begin @ 0600. Will continue to monitor pt. closely

## 2015-10-28 NOTE — Progress Notes (Signed)
eLink Physician-Brief Progress Note Patient Name: Dia CrawfordRobert D Sian DOB: 06/23/1951 MRN: [id number]   Date of Service  10/28/2015  HPI/Events of Note  sbp 80s and RN asking if to give lopressor.    ICD-9-CM ICD-10-CM   1. Acute ST elevation myocardial infarction (STEMI) involving other coronary artery of inferior wall (HCC)  I21.19 AMB Referral to Cardiac Rehabilitation - Phase II  2. Acute ST elevation myocardial infarction (STEMI) of lateral wall (HCC) 410.50 I21.29   3. Respiratory failure (HCC) 518.81 J96.90 DG Chest Port 1 View     DG Chest Port 1 View     eICU Interventions  Decrease scheduled loperssor dose from 25 to 12.5 q6h  Hold if sbp < 90     Intervention Category Intermediate Interventions: Hypotension - evaluation and management  Harold Mattes 10/28/2015, 1:09 AM

## 2015-10-28 NOTE — Progress Notes (Signed)
SUBJECTIVE:  The patient  converted to sinus rhythm yesterday afternoon with amiodarone drip. He had a much better night and was able to sleep. Chest pain and dyspnea resolved completely. Blood pressure was running low and the dose of metoprolol was decreased.  Filed Vitals:   10/28/15 0700 10/28/15 0800 10/28/15 0900 10/28/15 0906  BP: 99/70 102/69  112/65  Pulse: 78 74  74  Temp:   98.8 F (37.1 C)   TempSrc:   Oral   Resp: 22 28  23   Height:      Weight:      SpO2: 98% 94%  97%    Intake/Output Summary (Last 24 hours) at 10/28/15 0908 Last data filed at 10/28/15 0900  Gross per 24 hour  Intake 1069.5 ml  Output   3350 ml  Net -2280.5 ml    LABS: Basic Metabolic Panel:  Recent Labs  16/08/9611/24/16 0426 10/27/15 1624 10/28/15 0358  NA 138  --  139  K 4.3 3.7 3.4*  CL 106  --  107  CO2 25  --  25  GLUCOSE 149*  --  118*  BUN 19  --  19  CREATININE 0.79  --  0.93  CALCIUM 9.1  --  8.5*  MG  --   --  1.9  PHOS  --   --  2.6   Liver Function Tests:  Recent Labs  10/26/15 1523  AST 40  ALT 39  ALKPHOS 72  BILITOT 0.6  PROT 6.1*  ALBUMIN 3.8   No results for input(s): LIPASE, AMYLASE in the last 72 hours. CBC:  Recent Labs  10/27/15 0426 10/28/15 0358  WBC 19.8* 15.6*  HGB 17.0 14.9  HCT 49.2 42.4  MCV 91.7 90.6  PLT 296 207   Cardiac Enzymes:  Recent Labs  10/26/15 1523 10/27/15 0426  TROPONINI 1.08* 22.18*   BNP: Invalid input(s): POCBNP D-Dimer: No results for input(s): DDIMER in the last 72 hours. Hemoglobin A1C: No results for input(s): HGBA1C in the last 72 hours. Fasting Lipid Panel:  Recent Labs  10/28/15 0358  CHOL 157  HDL 24*  LDLCALC 79  TRIG [id number]*  CHOLHDL 6.5   Thyroid Function Tests: No results for input(s): TSH, T4TOTAL, T3FREE, THYROIDAB in the last 72 hours.  Invalid input(s): FREET3 Anemia Panel: No results for input(s): VITAMINB12, FOLATE, FERRITIN, TIBC, IRON, RETICCTPCT in the last 72 hours.   PHYSICAL  EXAM General: Well developed, well nourished, in no acute distress HEENT:  Normocephalic and atramatic Neck:  No JVD.  Lungs: Diminished breath sounds at the base. Heart: Irregularly irregular and mildly tachycardic. No murmurs by exam. Abdomen: Bowel sounds are positive, abdomen soft and non-tender  Msk:  Back normal, normal gait. Normal strength and tone for age. Extremities: No clubbing, cyanosis or edema.   Neuro: Alert and oriented X 3. Psych:  Good affect, responds appropriately  TELEMETRY: Reviewed telemetry pt in  normal sinus rhythm  Echocardiogram reviewed by me showed mildly reduced LV systolic function with ejection fraction of 40-45% and mild pulmonary hypertension.  ASSESSMENT AND PLAN:  1. Acute inferolateral ST elevation myocardial infarction: No evidence of recurrent ischemia. Continue dual antiplatelet therapy, metoprolol and a high-dose statin. Transfer to telemetry today.  2. Ventricular fibrillation: Likely due to coronary ischemia. Continue to replace electrolytes as needed. Continue metoprolol.   3. Acute systolic heart failure due to ischemic cardiomyopathy:  He appears to be euvolemic after she he was given one dose of IV furosemide. Continue  small dose metoprolol and lisinopril.  4. Post myocardial infarction Atrial fibrillation with rapid ventricular response: He converted to normal sinus rhythm with amiodarone. I switching the drip to by mouth amiodarone 200 mg twice daily.   5. Rare tobacco use: Smoking cessation is advised.  Lorine Bears, MD, [facility] 10/28/2015 9:08 AM

## 2015-10-28 NOTE — Progress Notes (Signed)
eLink Physician-Brief Progress Note Patient Name: Gilbert CrawfordRobert D Coleman DOB: 03/30/1951 MRN: [id number]   Date of Service  10/28/2015  HPI/Events of Note  /trk   Recent Labs Lab 10/26/15 1523 10/27/15 0426 10/27/15 1624 10/28/15 0358  K 3.4* 4.3 3.7 3.4*    Recent Labs Lab 10/26/15 1523 10/27/15 0426 10/28/15 0358  CREATININE 0.80 0.79 0.93      eICU Interventions  40 meq kcl Check mag and phos     Intervention Category Intermediate Interventions: Electrolyte abnormality - evaluation and management  Jaira Canady 10/28/2015, 5:59 AM

## 2015-10-28 NOTE — Progress Notes (Signed)
Report called to Elnita Maxwellheryl, RN on 2A.  Patient has been A&Ox4, denies pain.  No complaint of chest pain. VSS. Ambulating around room and up in chair during shift.

## 2015-10-28 NOTE — Progress Notes (Signed)
Patient moved to room 245 by wheelchair with orderly. 2A tele monitor applied to patient and verified with Aurther Lofterry, tele clerk prior to patient leaving ICU.  Patient left ICU alert with no distress noted, wife and son at side carrying personal belongings.

## 2015-10-29 ENCOUNTER — Other Ambulatory Visit: Payer: Self-pay | Admitting: Cardiovascular Disease

## 2015-10-29 MED ORDER — NITROGLYCERIN 0.4 MG SL SUBL: 0.4000 mg | SUBLINGUAL_TABLET | SUBLINGUAL | Status: DC | PRN

## 2015-10-29 MED ORDER — TICAGRELOR 90 MG PO TABS: 90.0000 mg | ORAL_TABLET | Freq: Two times a day (BID) | ORAL | Status: DC

## 2015-10-29 MED ORDER — ROSUVASTATIN CALCIUM 20 MG PO TABS: 20.0000 mg | ORAL_TABLET | Freq: Every day | ORAL | Status: DC

## 2015-10-29 MED ORDER — LISINOPRIL 5 MG PO TABS: 5.0000 mg | ORAL_TABLET | Freq: Every day | ORAL | Status: DC

## 2015-10-29 MED ORDER — AMIODARONE HCL 200 MG PO TABS
200.0000 mg | ORAL_TABLET | Freq: Two times a day (BID) | ORAL | Status: DC
Start: 2015-10-29 — End: 2015-12-17

## 2015-10-29 MED ORDER — METOPROLOL TARTRATE 25 MG PO TABS: 25.0000 mg | ORAL_TABLET | Freq: Two times a day (BID) | ORAL | Status: DC

## 2015-10-29 NOTE — Discharge Summary (Signed)
  This is 64 year old male with no previous cardiac history. He has known history of hyperlipidemia and presented with acute onset of chest pain. He was brought by EMS and was found to have inferolateral ST elevation. He developed ventricular fibrillation cardiac arrest and was defibrillated successfully. He arrived to the emergency room and he had recurrent episodes of ventricular fibrillation which required 4 or 5 defibrillations. He was taken emergently to the cardiac cath lab where cardiac catheterization showed subtotal thrombotic occlusion of the mid left circumflex and 70% stenosis of the proximal and midright coronary artery. He underwent successful angioplasty and drug-eluting stent placement. He developed atrial fibrillation with rapid ventricular response which was treated with intravenous amiodarone. He converted to normal sinus rhythm. He did have evidence of fluid overload which improved quickly with one dose of IV furosemide. He was transferred to telemetry and was able to ambulate without significant symptoms. Echocardiogram showed an ejection fraction of 40-45% with severe inferolateral hypokinesis with only mild mitral regurgitation. The patient was referred to cardiac rehabilitation. He can resume work in 2 weeks if he has no recurrent symptoms.   Total discharge time is 40 minutes.

## 2015-10-29 NOTE — Plan of Care (Signed)
Problem: Phase I Progression Outcomes Goal: Pain controlled with appropriate interventions Outcome: Progressing Pain free this shift. Goal: Voiding-avoid urinary catheter unless indicated Outcome: Completed/Met Date Met:  10/29/15 Voiding without difficulty in sufficient amounts Goal: Hemodynamically stable Outcome: Completed/Met Date Met:  10/29/15 Up ambulating in hallway without complaints of pain.  No distress noted  Problem: Phase II Progression Outcomes Goal: Ambulates up to 600 ft. in hall x 1 Outcome: Completed/Met Date Met:  10/29/15 Up ambulating in hallway without complaints of pain.  No distress noted

## 2015-10-29 NOTE — Progress Notes (Signed)
Patient d/c'd home. Education provided, no questions at this time. Patient to be picked up by wife. Telemetry removed. Cannen Dupras R [place]  

## 2015-10-29 NOTE — Care Management Note (Addendum)
Case Management Note  Patient Details  Name: Gilbert Coleman MRN: [id number] Date of Birth: July 23, 1951  Subjective/Objective:                  Met with patient and briefly with his wife Gilbert Coleman. Patient states his PCP was Dr. Caryn Section but it has been several years since he was been seen by PCP. He will follow up with cards Dr. Fletcher Anon per Dr. Fletcher Anon. Patient states he was not on any Rx at home but his wife shares same insurance and she uses [facility]. Phone: [phone]. He states he is independent with daily activities and able to drive. He denies RNCM needs. Dr. Fletcher Anon is concerned that Brillinta 90 mg BID for 30-days will be costly and would like RNCM to assist patient with this potential need.  Action/Plan: Brillinta 82m BID #60 called in to [facility] for price. Received callback from RN that patient's wife was able to obtain Rx for $40. No further RNCM needs. Case closed.  Expected Discharge Date:                  Expected Discharge Plan:     In-House Referral:     Discharge planning Services  Medication Assistance  Post Acute Care Choice:    Choice offered to:  Patient, Spouse  DME Arranged:    DME Agency:     HH Arranged:    [place]     Status of Service:  In process, will continue to follow  Medicare Important Message Given:    Date Medicare IM Given:    Medicare IM give by:    Date Additional Medicare IM Given:    Additional Medicare Important Message give by:     If discussed at [place] Meet[place], dates discussed:    Additional Comments:  AMarshell Garfinkel RN 10/29/2015, 10:51 AM

## 2015-10-29 NOTE — Discharge Instructions (Signed)
You can resume work on January 9th.

## 2015-10-30 ENCOUNTER — Telehealth: Payer: Self-pay | Admitting: Cardiovascular Disease

## 2015-10-30 ENCOUNTER — Encounter: Payer: Self-pay | Admitting: Cardiovascular Disease

## 2015-10-30 MED FILL — Medication: Qty: 1 | Status: AC

## 2015-10-30 NOTE — Telephone Encounter (Signed)
Needs 2 week fu  with Arida to discuss med changes

## 2015-11-01 ENCOUNTER — Ambulatory Visit (INDEPENDENT_AMBULATORY_CARE_PROVIDER_SITE_OTHER): Payer: 59 | Admitting: Nurse Practitioner

## 2015-11-01 ENCOUNTER — Encounter: Payer: Self-pay | Admitting: Nurse Practitioner

## 2015-11-01 VITALS — BP 90/64 | HR 63 | Ht 73.0 in | Wt 245.8 lb

## 2015-11-01 DIAGNOSIS — E785 Hyperlipidemia, unspecified: Secondary | ICD-10-CM | POA: Diagnosis not present

## 2015-11-01 DIAGNOSIS — I2510 Atherosclerotic heart disease of native coronary artery without angina pectoris: Secondary | ICD-10-CM

## 2015-11-01 DIAGNOSIS — I2121 ST elevation (STEMI) myocardial infarction involving left circumflex coronary artery: Secondary | ICD-10-CM

## 2015-11-01 DIAGNOSIS — I255 Ischemic cardiomyopathy: Secondary | ICD-10-CM

## 2015-11-01 DIAGNOSIS — I2119 ST elevation (STEMI) myocardial infarction involving other coronary artery of inferior wall: Secondary | ICD-10-CM

## 2015-11-01 DIAGNOSIS — I480 Paroxysmal atrial fibrillation: Secondary | ICD-10-CM

## 2015-11-01 NOTE — Progress Notes (Signed)
Office Visit    Patient Name: Gilbert CrawfordRobert D Dobberstein Date of Encounter: 11/01/2015  Primary Care Provider:  Mila Merryonald Fisher, MD Primary Cardiologist:  Judie PetitM. Kirke [name], MD   Chief Complaint     64 year old male status post recent inferolateral ST elevation MI who presents for follow-up.  Past Medical History    Past Medical History  Diagnosis Date  . CAD (coronary artery disease)     a. LM nl, LAD 1016m, 20d, D1/D2 min irregs, LCX 2859m (3.0x15 Xience Alpine DES), OM1/2 nl, OM3 30ost, RCA 70p/m, RPDA /RPL1/RPL2 nl, EF 45-50%-->admisison complicated by recurrent VF requiring Defib and amio.  . Ischemic cardiomyopathy     a. 10/2015 Echo: EF 40-45%, sev inflat HK, mild MR, mildly dil LA, PASP 42mmHg.  Marland Kitchen. Hyperlipidemia   . Morbid obesity (HCC)   . PAF (paroxysmal atrial fibrillation) (HCC)     a. At time of STEMI in setting of recurrent VF/defib/ACLS meds-->Amio.   Past Surgical History  Procedure Laterality Date  . Cardiac catheterization N/A 10/26/2015    Procedure: Left Heart Cath and Coronary Angiography;  Surgeon: Iran OuchMuhammad A Arida, MD;  Location: [facility] INVASIVE CV LAB;  Service: Cardiovascular;  Laterality: N/A;  . Cardiac catheterization N/A 10/26/2015    Procedure: Coronary Stent Intervention;  Surgeon: Iran OuchMuhammad A Arida, MD;  Location: [facility] INVASIVE CV LAB;  Service: Cardiovascular;  Laterality: N/A;  . Cataract extraction Bilateral   . Finger surgery      Allergies  Allergies  Allergen Reactions  . Atorvastatin     Other reaction(s): Muscle Pain    History of Present Illness     64 year old male with the above past medical history. He was recently admitted to [facility] with complaint of chest pain and was found to have and for lateral ST segment elevation. He underwent emergent diagnostic catheterization was complicated by multiple bouts of ventricular fibrillation requiring defibrillation. He also had paroxysmal atrial fibrillation and was placed on  intravenous amiodarone , which was subsequently transitioned to oral amiodarone. Catheters a short revealed severe left circumflex disease and this was successfully stented using a drug-eluting stent. Post procedure course was  complicated by mild volume overload , though patient did not require diuretic at discharge.   Since discharged just a few days ago, he has been doing well without recurrence of chest pain or dyspnea. He is tolerating his medications well. He has been compliant. He denies PND, orthopnea, dizziness, syncope, edema, or early satiety.  Home Medications    Prior to Admission medications   Medication Sig Start Date End Date Taking? Authorizing Provider  amiodarone (PACERONE) 200 MG tablet Take 1 tablet (200 mg total) by mouth 2 (two) times daily. 1 tablet twice daily for 1 week then 1 tablet daily after that. 10/29/15  Yes Iran OuchMuhammad A Arida, MD  aspirin 81 MG tablet Take 1 tablet by mouth daily.   Yes Historical Provider, MD  lisinopril (PRINIVIL,ZESTRIL) 5 MG tablet Take 1 tablet (5 mg total) by mouth daily. 10/29/15  Yes Iran OuchMuhammad A Arida, MD  metoprolol tartrate (LOPRESSOR) 25 MG tablet Take 1 tablet (25 mg total) by mouth 2 (two) times daily. 10/29/15  Yes Iran OuchMuhammad A Arida, MD  Misc Natural Products (GLUCOSAMINE CHONDROITIN COMPLX) TABS Take 1 tablet by mouth daily.   Yes Historical Provider, MD  Multiple Vitamin (MULTIVITAMIN WITH MINERALS) TABS tablet Take 1 tablet by mouth daily.   Yes Historical Provider, MD  nitroGLYCERIN (NITROSTAT) 0.4 MG SL tablet Place 1 tablet (  0.4 mg total) under the tongue every 5 (five) minutes as needed for chest pain. 10/29/15  Yes Iran Ouch, MD  rosuvastatin (CRESTOR) 20 MG tablet Take 1 tablet (20 mg total) by mouth daily at 6 PM. 10/29/15  Yes Iran Ouch, MD  ticagrelor (BRILINTA) 90 MG TABS tablet Take 1 tablet (90 mg total) by mouth 2 (two) times daily. 10/29/15  Yes Iran Ouch, MD    Review of Systems     doing well  since discharge.  He denies chest pain, palpitations, dyspnea, pnd, orthopnea, n, v, dizziness, syncope, edema, weight gain, or early satiety. All other systems reviewed and are otherwise negative except as noted above.  Physical Exam    VS:  BP 90/64 mmHg  Pulse 63  Ht  (1.854 m)  Wt 245 lb 12 oz (111.471 kg)  BMI 32.43 kg/m2 , BMI Body mass index is 32.43 kg/(m^2). GEN: Well nourished, well developed, in no acute distress. HEENT: normal. Neck: Supple, no JVD, carotid bruits, or masses. Cardiac: RRR, no murmurs, rubs, or gallops. No clubbing, cyanosis, edema.  Radials/DP/PT 2+ and equal bilaterally.  Right groin catheterization site without bleeding, bruit, or hematoma. Respiratory:  Respirations regular and unlabored, clear to auscultation bilaterally. GI: Soft, nontender, nondistended, BS + x 4. MS: no deformity or atrophy. Skin: warm and dry, no rash. Neuro:  Strength and sensation are intact. Psych: Normal affect.  Accessory Clinical Findings    ECG -  Regular sinus rhythm, 63 , left axis , inferior infarct , no acute ST or T changes.  Assessment & Plan    1.   Inferolateral ST segment elevation myocardial infarction, subsequent episode of care/coronary artery disease: Status post recent admission and PCI with drug-eluting stent placement to the left circumflex. He has done well since discharge without chest pain or dyspnea. He remains on aspirin,  brilinta, beta blocker, ACE inhibitor, and statin therapy. He is considering cardiac rehabilitation at this time.   2. Ischemic cardiopathy:  EF 40-45% by echocardiography.Euvolemic on exam. He remains on beta blocker and ACE inhibitor therapy. We discussed the importance of daily weights, sodium restriction, medication compliance, and symptom reporting and he verbalizes understanding.    3. Hyperlipidemia: LDL of 79 with normal LFTs upon recent admission. He is on moderate dose Crestor. Follow-up lipids and LFTs in 6 weeks.   4.  Morbid obesity: we discussed caloric intake and plans for activity. He is considering cardiac rehabilitation.   5.  Ventricular fibrillation arrest: in the setting of ischemia and STEMI. He remains on amiodarone and will reduce his dose within the next few days.   6. Paroxysmal Atrial fibrillation: noted in the setting of STEMI with the VF , defibrillation, and ACLS drugs. Quiescent on amiodarone. He is in sinus rhythm today with normal QT interval. He is on aspirin and brilinta and was not felt to require long-term oral anticoagulation given the setting of his A. Fib.  7.  Disposition: follow-up lipids and LFTs in 6 weeks with office follow-up around the same time.   Nicolasa Ducking, NP 11/01/2015, 4:54 PM

## 2015-11-01 NOTE — Patient Instructions (Addendum)
Medication Instructions:  Please continue your current medications  Labwork: Lipid, LFTS in 6 weeks (at your visit w/ Dr. Kirke [name])  Testing/Procedures: None  Follow-Up: 6 weeks w/ Dr. Kirke [name]  Date & time: ___________________________________________  If you need a refill on your cardiac medications before your next appointment, please call your pharmacy.

## 2015-11-26 ENCOUNTER — Telehealth: Payer: Self-pay

## 2015-11-26 NOTE — Telephone Encounter (Signed)
Faxed Health Event Claim Form to [phone]

## 2015-12-17 ENCOUNTER — Ambulatory Visit (INDEPENDENT_AMBULATORY_CARE_PROVIDER_SITE_OTHER): Payer: 59 | Admitting: Cardiovascular Disease

## 2015-12-17 ENCOUNTER — Encounter: Payer: Self-pay | Admitting: Cardiovascular Disease

## 2015-12-17 ENCOUNTER — Encounter: Payer: 59 | Admitting: Cardiovascular Disease

## 2015-12-17 ENCOUNTER — Other Ambulatory Visit: Payer: Self-pay

## 2015-12-17 ENCOUNTER — Encounter (INDEPENDENT_AMBULATORY_CARE_PROVIDER_SITE_OTHER): Payer: Self-pay

## 2015-12-17 VITALS — BP 110/62 | HR 57 | Ht 73.0 in | Wt 237.5 lb

## 2015-12-17 DIAGNOSIS — R0602 Shortness of breath: Secondary | ICD-10-CM

## 2015-12-17 DIAGNOSIS — I480 Paroxysmal atrial fibrillation: Secondary | ICD-10-CM | POA: Diagnosis not present

## 2015-12-17 DIAGNOSIS — E785 Hyperlipidemia, unspecified: Secondary | ICD-10-CM

## 2015-12-17 DIAGNOSIS — I2510 Atherosclerotic heart disease of native coronary artery without angina pectoris: Secondary | ICD-10-CM

## 2015-12-17 MED ORDER — LISINOPRIL 5 MG PO TABS: 5.0000 mg | ORAL_TABLET | Freq: Every day | ORAL | Status: DC

## 2015-12-17 MED ORDER — METOPROLOL TARTRATE 25 MG PO TABS: 25.0000 mg | ORAL_TABLET | Freq: Two times a day (BID) | ORAL | Status: DC

## 2015-12-17 MED ORDER — ROSUVASTATIN CALCIUM 20 MG PO TABS: 20.0000 mg | ORAL_TABLET | Freq: Every day | ORAL | Status: DC

## 2015-12-17 MED ORDER — TICAGRELOR 90 MG PO TABS: 90.0000 mg | ORAL_TABLET | Freq: Two times a day (BID) | ORAL | Status: DC

## 2015-12-17 NOTE — Patient Instructions (Addendum)
Medication Instructions:  Your physician recommends that you continue on your current medications as directed. Please refer to the Current Medication list given to you today.   Labwork: Fasting lipid and liver profile. Nothing to eat or drink after midnight the evening before your labs  Testing/Procedures: Your physician has requested that you have a lexiscan myoview. For further information please visit [email or web address] Please follow instruction sheet, as given.  [facility] MYOVIEW  Your caregiver has ordered a Stress Test with nuclear imaging. The purpose of this test is to evaluate the blood supply to your heart muscle. This procedure is referred to as a "Non-Invasive Stress Test." This is because other than having an IV started in your vein, nothing is inserted or "invades" your body. Cardiac stress tests are done to find areas of poor blood flow to the heart by determining the extent of coronary artery disease (CAD). Some patients exercise on a treadmill, which naturally increases the blood flow to your heart, while others who are  unable to walk on a treadmill due to physical limitations have a pharmacologic/chemical stress agent called Lexiscan . This medicine will mimic walking on a treadmill by temporarily increasing your coronary blood flow.   Please note: these test may take anywhere between 2-4 hours to complete  PLEASE REPORT TO [facility] MEDICAL MALL ENTRANCE  THE VOLUNTEERS AT THE FIRST DESK WILL DIRECT YOU WHERE TO GO  Date of Procedure:________Feb 23_________________________  Arrival Time for Procedure:____7:45am__________________  Instructions regarding medication:    __xx__:  Hold metoprolol the night before procedure and morning of procedure   PLEASE NOTIFY THE OFFICE AT LEAST 24 HOURS IN ADVANCE IF YOU ARE UNABLE TO KEEP YOUR APPOINTMENT.  [phone] AND  PLEASE NOTIFY NUCLEAR MEDICINE AT [facility] AT LEAST 24 HOURS IN ADVANCE IF YOU ARE UNABLE TO KEEP YOUR APPOINTMENT.  [phone]  How to prepare for your Myoview test:   Do not eat or drink after midnight  No caffeine for 24 hours prior to test  No smoking 24 hours prior to test.  Your medication may be taken with water.  If your doctor stopped a medication because of this test, do not take that medication.  Ladies, please do not wear dresses.  Skirts or pants are appropriate. Please wear a short sleeve shirt.  No perfume, cologne or lotion.  Wear comfortable walking shoes. No heels!            Follow-Up: Your physician recommends that you schedule a follow-up appointment in: 3 months with Dr. Kirke Corin.    Any Other Special Instructions Will Be Listed Below (If Applicable).     If you need a refill on your cardiac medications before your next appointment, please call your pharmacy.  Cardiac Nuclear Scanning A cardiac nuclear scan is used to check your heart for problems, such as the following:  A portion of the heart is not getting enough blood.  Part of the heart muscle has died, which happens with a heart attack.  The heart wall is not working normally.  In this test, a radioactive dye (tracer) is injected into your bloodstream. After the tracer has traveled to your heart, a scanning device is used to measure how much of the tracer is absorbed by or distributed to various areas of your heart. LET [facility] CARE PROVIDER KNOW ABOUT:  Any allergies you have.  All medicines you are taking, including vitamins, herbs, eye drops, creams, and over-the-counter medicines.  Previous problems you or members of your family have  had with the use of anesthetics.  Any blood disorders you have.  Previous surgeries you have had.  Medical conditions you have.  RISKS AND COMPLICATIONS Generally, this is a safe procedure. However, as with any procedure, problems can occur. Possible problems include:   Serious chest pain.  Rapid heartbeat.  Sensation of warmth in your chest. This  usually passes quickly. BEFORE THE PROCEDURE Ask your health care provider about changing or stopping your regular medicines. PROCEDURE This procedure is usually done at a hospital and takes 2-4 hours.  An IV tube is inserted into one of your veins.  Your health care provider will inject a small amount of radioactive tracer through the tube.  You will then wait for 20-40 minutes while the tracer travels through your bloodstream.  You will lie down on an exam table so images of your heart can be taken. Images will be taken for about 15-20 minutes.  You will exercise on a treadmill or stationary bike. While you exercise, your heart activity will be monitored with an electrocardiogram (ECG), and your blood pressure will be checked.  If you are unable to exercise, you may be given a medicine to make your heart beat faster.  When blood flow to your heart has peaked, tracer will again be injected through the IV tube.  After 20-40 minutes, you will get back on the exam table and have more images taken of your heart.  When the procedure is over, your IV tube will be removed. AFTER THE PROCEDURE  You will likely be able to leave shortly after the test. Unless your health care provider tells you otherwise, you may return to your normal schedule, including diet, activities, and medicines.  Make sure you find out how and when you will get your test results.   This information is not intended to replace advice given to you by your health care provider. Make sure you discuss any questions you have with your health care provider.   Document Released: 11/14/2004 Document Revised: 10/25/2013 Document Reviewed: 09/28/2013 Elsevier Interactive Patient Education [employer]

## 2015-12-18 NOTE — Assessment & Plan Note (Signed)
Lab Results  Component Value Date   CHOL 157 10/28/2015   HDL 24* 10/28/2015   LDLCALC 79 10/28/2015   TRIG 269* 10/28/2015   CHOLHDL 6.5 10/28/2015   Continue treatment with rosuvastatin with a target LDL of less than 70. I requested fasting lipid and liver profile.

## 2015-12-18 NOTE — Progress Notes (Signed)
Primary care physician: Dr. Sherrie Mustache.  HPI   65 year old male who is here today for a follow-up visit regarding coronary artery disease. He presented in December 2016 with inferior inferolateral ST elevation myocardial infarction which was complicated by ventricular fibrillation requiring multiple shocks. I proceeded with  emergent diagnostic catheterization which showed severe thrombotic disease in the mid left circumflex with 70% diffuse proximal RCA stenosis. He underwent successful angioplasty and drug-eluting stent placement to the mid left circumflex. He had atrial fibrillation after the procedure which was controlled with amiodarone. He converted to sinus rhythm. Echocardiogram showed an ejection fraction of 40-45% with mild mitral regurgitation. He has been doing reasonably well and denies any chest pain. He describes mild exertional dyspnea. He has been taking his medications regularly. He quit smoking since his myocardial infarction.  Allergies  Allergen Reactions  . Atorvastatin     Other reaction(s): Muscle Pain     Current Outpatient Prescriptions on File Prior to Visit  Medication Sig Dispense Refill  . aspirin 81 MG tablet Take 1 tablet by mouth daily.    . Misc Natural Products (GLUCOSAMINE CHONDROITIN COMPLX) TABS Take 1 tablet by mouth daily.    . Multiple Vitamin (MULTIVITAMIN WITH MINERALS) TABS tablet Take 1 tablet by mouth daily.    . nitroGLYCERIN (NITROSTAT) 0.4 MG SL tablet Place 1 tablet (0.4 mg total) under the tongue every 5 (five) minutes as needed for chest pain. 25 tablet 3   No current facility-administered medications on file prior to visit.     Past Medical History  Diagnosis Date  . CAD (coronary artery disease)     a. LM nl, LAD 15m, 20d, D1/D2 min irregs, LCX 45m (3.0x15 Xience Alpine DES), OM1/2 nl, OM3 30ost, RCA 70p/m, RPDA /RPL1/RPL2 nl, EF 45-50%-->admisison complicated by recurrent VF requiring Defib and amio.  . Ischemic cardiomyopathy     a.  10/2015 Echo: EF 40-45%, sev inflat HK, mild MR, mildly dil LA, PASP .  Marland Kitchen Hyperlipidemia   . Morbid obesity (HCC)   . PAF (paroxysmal atrial fibrillation) (HCC)     a. At time of STEMI in setting of recurrent VF/defib/ACLS meds-->Amio.     Past Surgical History  Procedure Laterality Date  . Cardiac catheterization N/A 10/26/2015    Procedure: Left Heart Cath and Coronary Angiography;  Surgeon: Iran Ouch, MD;  Location: [facility] INVASIVE CV LAB;  Service: Cardiovascular;  Laterality: N/A;  . Cardiac catheterization N/A 10/26/2015    Procedure: Coronary Stent Intervention;  Surgeon: Iran Ouch, MD;  Location: [facility] INVASIVE CV LAB;  Service: Cardiovascular;  Laterality: N/A;  . Cataract extraction Bilateral   . Finger surgery       Family History  Problem Relation Age of Onset  . Heart disease Father   . Heart disease Brother   . Heart disease Brother      Social History   Social History  . Marital Status: Married    Spouse Name: N/A  . Number of Children: N/A  . Years of Education: N/A   Occupational History  . Not on file.   Social History Main Topics  . Smoking status: Former Smoker -- 0.25 packs/day for 0 years    Types: Cigars  . Smokeless tobacco: Not on file  . Alcohol Use: No  . Drug Use: No  . Sexual Activity: Not on file   Other Topics Concern  . Not on file   Social History Narrative     ROS A 10 point review  of system was performed. It is negative other than that mentioned in the history of present illness.   PHYSICAL EXAM   BP 110/62 mmHg  Pulse 57  Ht  (1.854 m)  Wt 237 lb 8 oz (107.729 kg)  BMI 31.34 kg/m2 Constitutional: He is oriented to person, place, and time. He appears well-developed and well-nourished. No distress.  HENT: No nasal discharge.  Head: Normocephalic and atraumatic.  Eyes: Pupils are equal and round.  No discharge. Neck: Normal range of motion. Neck supple. No JVD present. No thyromegaly present.    Cardiovascular: Normal rate, regular rhythm, normal heart sounds. Exam reveals no gallop and no friction rub. No murmur heard.  Pulmonary/Chest: Effort normal and breath sounds normal. No stridor. No respiratory distress. He has no wheezes. He has no rales. He exhibits no tenderness.  Abdominal: Soft. Bowel sounds are normal. He exhibits no distension. There is no tenderness. There is no rebound and no guarding.  Musculoskeletal: Normal range of motion. He exhibits no edema and no tenderness.  Neurological: He is alert and oriented to person, place, and time. Coordination normal.  Skin: Skin is warm and dry. No rash noted. He is not diaphoretic. No erythema. No pallor.  Psychiatric: He has a normal mood and affect. His behavior is normal. Judgment and thought content normal.      Sinus bradycardia with no significant ST or T wave changes. EKG:   ASSESSMENT AND PLAN

## 2015-12-18 NOTE — Assessment & Plan Note (Signed)
He is doing reasonably well with only mild exertional dyspnea. I reviewed cardiac catheterization images with him today. He still has 70% disease in the proximal right coronary artery which might be obstructive. Thus, I requested a treadmill nuclear stress test to evaluate for residual ischemia in that area. Otherwise continue aggressive medical therapy.

## 2015-12-18 NOTE — Assessment & Plan Note (Signed)
This was post myocardial infarction. He has been off amiodarone for more than a month with no evidence of recurrent arrhythmia.

## 2015-12-26 ENCOUNTER — Telehealth: Payer: Self-pay

## 2015-12-26 NOTE — Telephone Encounter (Signed)
Reviewed treadmill myoview instructions w/pt who verbalized understanding.  

## 2015-12-27 ENCOUNTER — Encounter
Admission: RE | Admit: 2015-12-27 | Discharge: 2015-12-27 | Disposition: A | Discharge status: Home / Self Care | Payer: 59 | Source: Ambulatory Visit | Attending: Cardiovascular Disease | Admitting: Cardiovascular Disease

## 2015-12-27 ENCOUNTER — Other Ambulatory Visit: Admission: RE | Admit: 2015-12-27 | Payer: 59 | Source: Ambulatory Visit | Admitting: Cardiovascular Disease

## 2015-12-27 ENCOUNTER — Other Ambulatory Visit
Admission: RE | Admit: 2015-12-27 | Discharge: 2015-12-27 | Disposition: A | Discharge status: Home / Self Care | Payer: 59 | Source: Ambulatory Visit | Attending: Cardiovascular Disease | Admitting: Cardiovascular Disease

## 2015-12-27 DIAGNOSIS — R0602 Shortness of breath: Secondary | ICD-10-CM | POA: Insufficient documentation

## 2015-12-27 DIAGNOSIS — E785 Hyperlipidemia, unspecified: Secondary | ICD-10-CM | POA: Diagnosis not present

## 2015-12-27 DIAGNOSIS — I5189 Other ill-defined heart diseases: Secondary | ICD-10-CM | POA: Insufficient documentation

## 2015-12-27 LAB — NM MYOCAR MULTI W/SPECT W/WALL MOTION / EF
CHL CUP NUCLEAR SDS: 6
CHL CUP NUCLEAR SRS: 3
CHL CUP NUCLEAR SSS: 7
CHL CUP STRESS STAGE 2 GRADE: 11.9 %
CHL CUP STRESS STAGE 2 HR: 126 {beats}/min
CHL CUP STRESS STAGE 3 GRADE: 12 %
CHL CUP STRESS STAGE 3 HR: 126 {beats}/min
CHL CUP STRESS STAGE 3 SPEED: 2.3 mph
CHL CUP STRESS STAGE 4 HR: 110 {beats}/min
CHL CUP STRESS STAGE 5 DBP: 68 mmHg
CHL CUP STRESS STAGE 5 GRADE: 0 %
CHL CUP STRESS STAGE 5 SBP: 127 mmHg
CHL CUP STRESS STAGE 5 SPEED: 0 mph
CSEPHR: 82 %
Estimated workload: 4.9 METS
Exercise duration (min): 3 min
LV dias vol: 97 mL
LV sys vol: 25 mL
Peak HR: 126 {beats}/min
Percent of predicted max HR: 80 %
Rest HR: 58 {beats}/min
Stage 1 Grade: 10 %
Stage 1 HR: 125 {beats}/min
Stage 1 Speed: 1.7 mph
Stage 2 Speed: 2.5 mph
Stage 4 Grade: 0 %
Stage 4 Speed: 0 mph
Stage 5 HR: 70 {beats}/min
TID: 1

## 2015-12-27 LAB — HEPATIC FUNCTION PANEL
ALK PHOS: 56 U/L (ref 38–126)
ALT: 28 U/L (ref 17–63)
AST: 22 U/L (ref 15–41)
Albumin: 4.4 g/dL (ref 3.5–5.0)
BILIRUBIN DIRECT: 0.1 mg/dL (ref 0.1–0.5)
BILIRUBIN INDIRECT: 0.6 mg/dL (ref 0.3–0.9)
Total Bilirubin: 0.7 mg/dL (ref 0.3–1.2)
Total Protein: 7.3 g/dL (ref 6.5–8.1)

## 2015-12-27 LAB — LIPID PANEL
CHOL/HDL RATIO: 3.2 ratio
Cholesterol: 97 mg/dL (ref 0–200)
HDL: 30 mg/dL — AB (ref >40)
LDL Cholesterol: 42 mg/dL (ref 0–99)
TRIGLYCERIDES: 124 mg/dL (ref <150)
VLDL: 25 mg/dL (ref 0–40)

## 2015-12-27 MED ORDER — TECHNETIUM TC 99M SESTAMIBI GENERIC - CARDIOLITE
13.1500 | Freq: Once | INTRAVENOUS | Status: AC | PRN
  Administered 2015-12-27: 13.15 via INTRAVENOUS

## 2015-12-27 MED ORDER — TECHNETIUM TC 99M SESTAMIBI - CARDIOLITE
30.0000 | Freq: Once | INTRAVENOUS | Status: AC | PRN
  Administered 2015-12-27: 30.29 via INTRAVENOUS

## 2016-02-21 DIAGNOSIS — J309 Allergic rhinitis, unspecified: Secondary | ICD-10-CM | POA: Insufficient documentation

## 2016-02-21 DIAGNOSIS — Z8249 Family history of ischemic heart disease and other diseases of the circulatory system: Secondary | ICD-10-CM | POA: Insufficient documentation

## 2016-02-21 DIAGNOSIS — Z86010 Personal history of colonic polyps: Secondary | ICD-10-CM | POA: Insufficient documentation

## 2016-02-21 DIAGNOSIS — R12 Heartburn: Secondary | ICD-10-CM | POA: Insufficient documentation

## 2016-02-21 DIAGNOSIS — R200 Anesthesia of skin: Secondary | ICD-10-CM | POA: Insufficient documentation

## 2016-02-21 DIAGNOSIS — E781 Pure hyperglyceridemia: Secondary | ICD-10-CM | POA: Insufficient documentation

## 2016-02-21 DIAGNOSIS — Z860101 Personal history of adenomatous and serrated colon polyps: Secondary | ICD-10-CM | POA: Insufficient documentation

## 2016-02-21 DIAGNOSIS — K635 Polyp of colon: Secondary | ICD-10-CM | POA: Insufficient documentation

## 2016-02-22 ENCOUNTER — Ambulatory Visit (INDEPENDENT_AMBULATORY_CARE_PROVIDER_SITE_OTHER): Payer: 59 | Admitting: Family Medicine

## 2016-02-22 ENCOUNTER — Encounter: Payer: Self-pay | Admitting: Family Medicine

## 2016-02-22 VITALS — BP 106/60 | HR 60 | Temp 98.6°F | Resp 16 | Ht 73.0 in | Wt 234.0 lb

## 2016-02-22 DIAGNOSIS — H409 Unspecified glaucoma: Secondary | ICD-10-CM | POA: Insufficient documentation

## 2016-02-22 DIAGNOSIS — H269 Unspecified cataract: Secondary | ICD-10-CM | POA: Insufficient documentation

## 2016-02-22 DIAGNOSIS — G459 Transient cerebral ischemic attack, unspecified: Secondary | ICD-10-CM

## 2016-02-22 DIAGNOSIS — R739 Hyperglycemia, unspecified: Secondary | ICD-10-CM

## 2016-02-22 LAB — POCT GLYCOSYLATED HEMOGLOBIN (HGB A1C)
Est. average glucose Bld gHb Est-mCnc: 114
HEMOGLOBIN A1C: 5.6

## 2016-02-22 NOTE — Progress Notes (Signed)
Patient: Gilbert Coleman Male    DOB: [birth year]   65 y.o.   MRN: [id number] Visit Date: 02/22/2016  Today's Provider: Mila Merry, MD   Chief Complaint  Patient presents with  . Shaking   Subjective:    HPI Shaking: Patient comes in today requesting to be tested for Diabetes. He states earlier this week he felt shaky and dizzy like his blood sugar was low. He states he had to eat 3/4 bag of ginger snaps before the symptoms started to improve. Patient has also had numbness in his feet and intermittent numbness in his hands, the right more so than the left. He was hospitalized in December for MI and sugar ran in the mid 100s.  Patient states he does have to urinate more but says that may be due to his increase in fluid intake after his heart attack 4 months ago.     Allergies  Allergen Reactions  . Atorvastatin     Other reaction(s): Muscle Pain   Previous Medications   ASPIRIN 81 MG TABLET    Take 1 tablet by mouth daily.   LISINOPRIL (PRINIVIL,ZESTRIL) 5 MG TABLET    Take 1 tablet (5 mg total) by mouth daily.   METOPROLOL TARTRATE (LOPRESSOR) 25 MG TABLET    Take 1 tablet (25 mg total) by mouth 2 (two) times daily.   MISC NATURAL PRODUCTS (GLUCOSAMINE CHONDROITIN COMPLX) TABS    Take 1 tablet by mouth daily.   MULTIPLE VITAMIN (MULTIVITAMIN WITH MINERALS) TABS TABLET    Take 1 tablet by mouth daily.   NITROGLYCERIN (NITROSTAT) 0.4 MG SL TABLET    Place 1 tablet (0.4 mg total) under the tongue every 5 (five) minutes as needed for chest pain.   ROSUVASTATIN (CRESTOR) 20 MG TABLET    Take 1 tablet (20 mg total) by mouth daily at 6 PM.   TICAGRELOR (BRILINTA) 90 MG TABS TABLET    Take 1 tablet (90 mg total) by mouth 2 (two) times daily.    Review of Systems  Constitutional: Negative for fever, chills and appetite change.  Respiratory: Negative for chest tightness, shortness of breath and wheezing.   Cardiovascular: Negative for chest pain and palpitations.  Gastrointestinal:  Negative for nausea, vomiting and abdominal pain.  Endocrine: Positive for polyuria. Negative for polydipsia and polyphagia.  Neurological: Positive for dizziness, light-headedness and numbness (in feet).    Social History  Substance Use Topics  . Smoking status: Former Smoker -- 0.25 packs/day for 30 years    Types: Cigars    Quit date: 11/03/2009  . Smokeless tobacco: Not on file  . Alcohol Use: 0.0 oz/week    0 Standard drinks or equivalent per week     Comment: occasional use   Objective:   BP 106/60 mmHg  Pulse 60  Temp(Src) 98.6 F (37 C) (Oral)  Resp 16  Ht  (1.854 m)  Wt 234 lb (106.142 kg)  BMI 30.88 kg/m2  SpO2 96%  Physical Exam   General Appearance:    Alert, cooperative, no distress  Eyes:    PERRL, conjunctiva/corneas clear, EOM's intact       Lungs:     Clear to auscultation bilaterally, respirations unlabored  Heart:    Regular rate and rhythm. No murmurs rubs or gallops.   Neurologic:   Awake, alert, oriented x 3. No apparent focal neurological           defect.  Results for orders placed or performed in visit on 02/22/16  POCT glycosylated hemoglobin (Hb A1C)  Result Value Ref Range   Hemoglobin A1C 5.6    Est. average glucose Bld gHb Est-mCnc 114         Assessment & Plan:     1. Transient cerebral ischemia, unspecified transient cerebral ischemia type  - US Carotid Duplex Right; Future  2. Hyperglycemia  - POCT glycosylated hemoglobin (Hb A1C)        Mila Merryonald Fisher, MD  [facility]

## 2016-02-28 ENCOUNTER — Ambulatory Visit
Admission: RE | Admit: 2016-02-28 | Discharge: 2016-02-28 | Disposition: A | Discharge status: Home / Self Care | Payer: 59 | Source: Ambulatory Visit | Attending: Family Medicine | Admitting: Family Medicine

## 2016-02-28 DIAGNOSIS — G459 Transient cerebral ischemic attack, unspecified: Secondary | ICD-10-CM | POA: Insufficient documentation

## 2016-02-29 ENCOUNTER — Other Ambulatory Visit: Payer: Self-pay | Admitting: Family Medicine

## 2016-02-29 ENCOUNTER — Telehealth: Payer: Self-pay

## 2016-02-29 DIAGNOSIS — G459 Transient cerebral ischemic attack, unspecified: Secondary | ICD-10-CM

## 2016-02-29 NOTE — Telephone Encounter (Signed)
Advised patient of results. Dr. Sherrie MustacheFisher, have you ordered test already? Im not sure on which test to order. Thanks!

## 2016-02-29 NOTE — Telephone Encounter (Signed)
-----   Message from Malva Limesonald E Fisher, MD sent at 02/29/2016 10:48 AM EDT ----- Ultrasound indicates possible blockage in left vertebral artery, which is artery that goes up back of neck, which can cause dizziness, weakness, light headedness or blurry vision. Needs further evaluation with CTA. Please advise, have entered order and sent message to Maralyn SagoSarah to schedule.

## 2016-02-29 NOTE — Progress Notes (Unsigned)
Please schedule CT angiogram of left vertebral artery for diagnosis of TIA with abnormal ultrasound of left vertebral artery.   I entered order into Epic, but I can't sign it. I don't know why.

## 2016-03-03 NOTE — Telephone Encounter (Signed)
I don't know how to order this... Can't find it in Epic. Please call radiology and find out how to order CT angiogram of left vertebral artery.

## 2016-03-04 ENCOUNTER — Other Ambulatory Visit: Payer: Self-pay | Admitting: Neurology

## 2016-03-04 NOTE — Telephone Encounter (Signed)
CT Angio of neck [id number]

## 2016-03-11 ENCOUNTER — Other Ambulatory Visit: Payer: Self-pay | Admitting: Family Medicine

## 2016-03-11 ENCOUNTER — Ambulatory Visit
Admission: RE | Admit: 2016-03-11 | Discharge: 2016-03-11 | Disposition: A | Discharge status: Home / Self Care | Payer: 59 | Source: Ambulatory Visit | Attending: Family Medicine | Admitting: Family Medicine

## 2016-03-11 DIAGNOSIS — I6522 Occlusion and stenosis of left carotid artery: Secondary | ICD-10-CM | POA: Diagnosis not present

## 2016-03-11 DIAGNOSIS — G459 Transient cerebral ischemic attack, unspecified: Secondary | ICD-10-CM | POA: Insufficient documentation

## 2016-03-11 DIAGNOSIS — I6501 Occlusion and stenosis of right vertebral artery: Secondary | ICD-10-CM

## 2016-03-11 LAB — POCT I-STAT CREATININE: CREATININE: 0.9 mg/dL (ref 0.61–1.24)

## 2016-03-11 MED ORDER — IOPAMIDOL (ISOVUE-370) INJECTION 76%
75.0000 mL | Freq: Once | INTRAVENOUS | Status: AC | PRN
  Administered 2016-03-11: 75 mL via INTRAVENOUS

## 2016-03-12 ENCOUNTER — Telehealth: Payer: Self-pay | Admitting: Family Medicine

## 2016-03-12 NOTE — Telephone Encounter (Signed)
Pt would like to get results from CTA

## 2016-03-12 NOTE — Telephone Encounter (Signed)
Patient was notified.

## 2016-03-17 ENCOUNTER — Ambulatory Visit: Payer: 59 | Admitting: Cardiovascular Disease

## 2016-03-21 ENCOUNTER — Encounter: Payer: Self-pay | Admitting: Cardiovascular Disease

## 2016-03-21 ENCOUNTER — Ambulatory Visit (INDEPENDENT_AMBULATORY_CARE_PROVIDER_SITE_OTHER): Payer: 59 | Admitting: Cardiovascular Disease

## 2016-03-21 VITALS — BP 110/64 | HR 57 | Ht 73.0 in | Wt 235.2 lb

## 2016-03-21 DIAGNOSIS — I2510 Atherosclerotic heart disease of native coronary artery without angina pectoris: Secondary | ICD-10-CM

## 2016-03-21 DIAGNOSIS — I255 Ischemic cardiomyopathy: Secondary | ICD-10-CM | POA: Diagnosis not present

## 2016-03-21 DIAGNOSIS — I480 Paroxysmal atrial fibrillation: Secondary | ICD-10-CM

## 2016-03-21 NOTE — Patient Instructions (Signed)
Medication Instructions: Continue same medications.   Labwork: None.   Procedures/Testing: None.   Follow-Up: 6 months with Dr. Arida.   Any Additional Special Instructions Will Be Listed Below (If Applicable).     If you need a refill on your cardiac medications before your next appointment, please call your pharmacy.   

## 2016-03-21 NOTE — Progress Notes (Signed)
Cardiology Office Note   Date:  03/21/2016   ID:  Gilbert Coleman, DOB 02/08/1951, MRN [id number]  PCP:  Mila Merryonald Fisher, MD  Cardiologist:   Lorine BearsMuhammad Arida, MD   Chief Complaint  Patient presents with  . other    3 month follow up. Meds reviewed by the patient verbally. "doing well." Pt. had a carotid u/s and [facility].       History of Present Illness: Gilbert Coleman is a 65 y.o. male who presents for a follow-up visit regarding coronary artery disease. He presented in December 2016 with inferolateral ST elevation myocardial infarction which was complicated by ventricular fibrillation requiring multiple shocks. I proceeded with  emergent diagnostic catheterization which showed severe thrombotic disease in the mid left circumflex with 70% diffuse proximal RCA stenosis. He underwent successful angioplasty and drug-eluting stent placement to the mid left circumflex. He had atrial fibrillation after the procedure which was controlled with amiodarone. He converted to sinus rhythm. Echocardiogram showed an ejection fraction of 40-45% with mild mitral regurgitation. He underwent a treadmill stress test in February 2017 which showed possible mild apical ischemia, normal ejection fraction, no ST deviation with exercise and frequent PVCs with exercise. There was no evidence of inferior wall ischemia. He has been doing very well from a cardiac standpoint with no chest pain or shortness of breath. He had an episode of dizziness and shaking in his hands in April which lasted for about 15 minutes. He thought it was due to hypoglycemia and he ate something sweet. He saw Dr. Sherrie MustacheFisher and was suspected of having TIA. He underwent carotid Doppler which showed no evidence of obstructive disease in the carotid arteries but there was abnormal flow in the left vertebral artery. He underwent CTA which showed occlusion of the left vertebral artery. He reports no recurrent symptoms since then.  Past  Medical History  Diagnosis Date  . CAD (coronary artery disease)     a. LM nl, LAD 2821m, 20d, D1/D2 min irregs, LCX 2944m (3.0x15 Xience Alpine DES), OM1/2 nl, OM3 30ost, RCA 70p/m, RPDA /RPL1/RPL2 nl, EF 45-50%-->admisison complicated by recurrent VF requiring Defib and amio.  . Ischemic cardiomyopathy     a. 10/2015 Echo: EF 40-45%, sev inflat HK, mild MR, mildly dil LA, PASP 42mmHg.  Marland Kitchen. Hyperlipidemia   . Morbid obesity (HCC)   . PAF (paroxysmal atrial fibrillation) (HCC)     a. At time of STEMI in setting of recurrent VF/defib/ACLS meds-->Amio.  Marland Kitchen. History of chicken pox   . History of measles     Past Surgical History  Procedure Laterality Date  . Cardiac catheterization N/A 10/26/2015    Procedure: Left Heart Cath and Coronary Angiography;  Surgeon: Iran OuchMuhammad A Arida, MD;  Location: [facility] INVASIVE CV LAB;  Service: Cardiovascular;  Laterality: N/A;  . Cardiac catheterization N/A 10/26/2015    Procedure: Coronary Stent Intervention;  Surgeon: Iran OuchMuhammad A Arida, MD;  Location: [facility] INVASIVE CV LAB;  Service: Cardiovascular;  Laterality: N/A;  . Cataract extraction Bilateral   . Finger surgery    . Colonoscopy  06/21/2014    Diminutive polyps. Tubular ademona, Hyperplastic polyps. Repeat 06/2019     Current Outpatient Prescriptions  Medication Sig Dispense Refill  . aspirin 81 MG tablet Take 1 tablet by mouth daily.    Marland Kitchen. lisinopril (PRINIVIL,ZESTRIL) 5 MG tablet Take 1 tablet (5 mg total) by mouth daily. 90 tablet 3  . metoprolol tartrate (LOPRESSOR) 25 MG tablet Take  1 tablet (25 mg total) by mouth 2 (two) times daily. 180 tablet 3  . Misc Natural Products (GLUCOSAMINE CHONDROITIN COMPLX) TABS Take 1 tablet by mouth daily.    . Multiple Vitamin (MULTIVITAMIN WITH MINERALS) TABS tablet Take 1 tablet by mouth daily.    . nitroGLYCERIN (NITROSTAT) 0.4 MG SL tablet Place 1 tablet (0.4 mg total) under the tongue every 5 (five) minutes as needed for chest pain. 25 tablet 3  . rosuvastatin  (CRESTOR) 20 MG tablet Take 1 tablet (20 mg total) by mouth daily at 6 PM. 90 tablet 3  . ticagrelor (BRILINTA) 90 MG TABS tablet Take 1 tablet (90 mg total) by mouth 2 (two) times daily. 180 tablet 3   No current facility-administered medications for this visit.    Allergies:   Atorvastatin    Social History:  The patient  reports that he quit smoking about 6 years ago. His smoking use included Cigars. He does not have any smokeless tobacco history on file. He reports that he drinks alcohol. He reports that he does not use illicit drugs.   Family History:  The patient's family history includes Alzheimer's disease in his mother; Heart disease in his brother, brother, and father.    ROS:  Please see the history of present illness.   Otherwise, review of systems are positive for none.   All other systems are reviewed and negative.    PHYSICAL EXAM: VS:  BP 110/64 mmHg  Pulse 57  Ht  (1.854 m)  Wt 235 lb 4 oz (106.709 kg)  BMI 31.04 kg/m2 , BMI Body mass index is 31.04 kg/(m^2). GEN: Well nourished, well developed, in no acute distress HEENT: normal Neck: no JVD, carotid bruits, or masses Cardiac: RRR; no murmurs, rubs, or gallops,no edema  Respiratory:  clear to auscultation bilaterally, normal work of breathing GI: soft, nontender, nondistended, + BS MS: no deformity or atrophy Skin: warm and dry, no rash Neuro:  Strength and sensation are intact Psych: euthymic mood, full affect   EKG:  EKG is ordered today. The ekg ordered today demonstrates sinus bradycardia with no significant ST or T wave changes   Recent Labs: 10/28/2015: BUN 19; Hemoglobin 14.9; Magnesium 1.9; Platelets 207; Potassium 3.4*; Sodium 139 12/27/2015: ALT 28 03/11/2016: Creatinine, Ser 0.90    Lipid Panel    Component Value Date/Time   CHOL 97 12/27/2015 1014   TRIG 124 12/27/2015 1014   HDL 30* 12/27/2015 1014   CHOLHDL 3.2 12/27/2015 1014   VLDL 25 12/27/2015 1014   LDLCALC 42 12/27/2015 1014       Wt Readings from Last 3 Encounters:  03/21/16 235 lb 4 oz (106.709 kg)  02/22/16 234 lb (106.142 kg)  12/17/15 237 lb 8 oz (107.729 kg)      Other studies Reviewed: Additional studies/ records that were reviewed today include: CTA. Review of the above records demonstrates: Results outlined above   ASSESSMENT AND PLAN:  1.  Coronary artery disease involving native coronary arteries without angina: He is doing very well from a cardiac standpoint with no anginal symptoms. Continue dual antiplatelet therapy for at least one year.  2.  Hyperlipidemia:  Most recent lipid profile in February showed an LDL of 42. Continue treatment with rosuvastatin.  3. Ischemic cardiomyopathy: Most recent ejection fraction was normal. Continue small dose metoprolol and lisinopril.  4. Left vertebral artery occlusion: This could have been responsible for his symptoms in April. The patient currently has no recurrent symptoms and there  is no indication for revascularization. There was no evidence of obstructive disease involving his carotid arteries. Continue medical therapy.   Disposition:   FU with me in 6 months  Signed,  Lorine Bears, MD  03/21/2016 8:40 AM    [facility]

## 2016-03-25 ENCOUNTER — Ambulatory Visit: Payer: 59 | Admitting: Cardiovascular Disease

## 2016-04-10 ENCOUNTER — Ambulatory Visit (INDEPENDENT_AMBULATORY_CARE_PROVIDER_SITE_OTHER): Payer: 59 | Admitting: Family Medicine

## 2016-04-10 ENCOUNTER — Telehealth: Payer: Self-pay | Admitting: Family Medicine

## 2016-04-10 ENCOUNTER — Encounter: Payer: Self-pay | Admitting: Family Medicine

## 2016-04-10 VITALS — BP 124/70 | HR 72 | Temp 98.1°F | Resp 16 | Wt 238.0 lb

## 2016-04-10 DIAGNOSIS — K648 Other hemorrhoids: Secondary | ICD-10-CM

## 2016-04-10 DIAGNOSIS — K51219 Ulcerative (chronic) proctitis with unspecified complications: Secondary | ICD-10-CM

## 2016-04-10 MED ORDER — HYDROCORTISONE ACETATE 25 MG RE SUPP: 25.0000 mg | Freq: Two times a day (BID) | RECTAL | Status: DC

## 2016-04-10 NOTE — Telephone Encounter (Signed)
Please advise 

## 2016-04-10 NOTE — Progress Notes (Signed)
Patient ID: Gilbert CrawfordRobert D Coleman, male   DOB: 09/11/1951, 65 y.o.   MRN: [id number]    Subjective:  HPI Pt is here today for hemorrhoids he has had trouble with them for years. But over the past 3 weeks he has had a "flare" He reports that it hurts to sit down. He has seen blood in stool, but he has many times in the past. He reports that he has had 2 colonoscopy and the last one was about a year ago. Pt reports that he thinks they may be internal but he can feel something on the outside of his rectum. He reports that he thinks he may also have something going on with his prostate because he can feel pressure in that area.   Prior to Admission medications   Medication Sig Start Date End Date Taking? Authorizing Provider  aspirin 81 MG tablet Take 1 tablet by mouth daily.   Yes Historical Provider, MD  lisinopril (PRINIVIL,ZESTRIL) 5 MG tablet Take 1 tablet (5 mg total) by mouth daily. 12/17/15  Yes Iran OuchMuhammad A Arida, MD  metoprolol tartrate (LOPRESSOR) 25 MG tablet Take 1 tablet (25 mg total) by mouth 2 (two) times daily. 12/17/15  Yes Iran OuchMuhammad A Arida, MD  Misc Natural Products (GLUCOSAMINE CHONDROITIN COMPLX) TABS Take 1 tablet by mouth daily.   Yes Historical Provider, MD  Multiple Vitamin (MULTIVITAMIN WITH MINERALS) TABS tablet Take 1 tablet by mouth daily.   Yes Historical Provider, MD  nitroGLYCERIN (NITROSTAT) 0.4 MG SL tablet Place 1 tablet (0.4 mg total) under the tongue every 5 (five) minutes as needed for chest pain. 10/29/15  Yes Iran OuchMuhammad A Arida, MD  rosuvastatin (CRESTOR) 20 MG tablet Take 1 tablet (20 mg total) by mouth daily at 6 PM. 12/17/15  Yes Iran OuchMuhammad A Arida, MD  ticagrelor (BRILINTA) 90 MG TABS tablet Take 1 tablet (90 mg total) by mouth 2 (two) times daily. 12/17/15  Yes Iran OuchMuhammad A Arida, MD    Patient Active Problem List   Diagnosis Date Noted  . Cataract 02/22/2016  . Glaucoma 02/22/2016  . Allergic rhinitis 02/21/2016  . Hypertriglyceridemia 02/21/2016  . Colon polyp  02/21/2016  . Fam hx-ischem heart disease 02/21/2016  . H/O adenomatous polyp of colon 02/21/2016  . Numbness 02/21/2016  . PAF (paroxysmal atrial fibrillation) (HCC)   . Morbid obesity (HCC)   . Hyperlipidemia   . Ischemic cardiomyopathy   . CAD (coronary artery disease)   . ST elevation myocardial infarction (STEMI) of inferior wall (HCC) 10/26/2015  . Acute ST elevation myocardial infarction (STEMI) [facility](HCC)     Past Medical History  Diagnosis Date  . CAD (coronary artery disease)     a. 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  . Ischemic cardiomyopathy     a. 10/2015 Echo: EF 40-45%, sev inflat HK, mild MR, mildly dil LA, PASP 42mmHg.  Marland Kitchen. Hyperlipidemia   . Morbid obesity (HCC)   . PAF (paroxysmal atrial fibrillation) (HCC)     a. At time of STEMI in setting of recurrent VF/defib/ACLS meds-->Amio.  Marland Kitchen. History of chicken pox   . History of measles     Social History   Social History  . Marital Status: Married    Spouse Name: N/A  . Number of Children: 1  . Years of Education: N/A   Occupational History  . Not  on file.   Social History Main Topics  . Smoking status: Former Smoker -- 0.25 packs/day for 30 years    Types: Cigars    Quit date: 11/03/2009  . Smokeless tobacco: Not on file  . Alcohol Use: 0.0 oz/week    0 Standard drinks or equivalent per week     Comment: occasional use  . Drug Use: No  . Sexual Activity: Not on file   Other Topics Concern  . Not on file   Social History Narrative    Allergies  Allergen Reactions  . Atorvastatin     Other reaction(s): Muscle Pain    Review of Systems  Constitutional: Negative.   HENT: Negative.   Eyes: Negative.   Respiratory: Negative.   Cardiovascular: Negative.   Gastrointestinal: Positive for constipation and blood in stool.  Genitourinary:         Pressure in prostate area.   Musculoskeletal: Negative.   Skin: Negative.   Neurological: Negative.   Endo/Heme/Allergies: Negative.   Psychiatric/Behavioral: Negative.     Immunization History  Administered Date(s) Administered  . Tdap 04/24/2011  . Zoster 09/20/2014   Objective:  BP 124/70 mmHg  Pulse 72  Temp(Src) 98.1 F (36.7 C) (Oral)  Resp 16  Wt 238 lb (107.956 kg)  Physical Exam  Constitutional: He is well-developed, well-nourished, and in no distress.  HENT:  Head: Normocephalic and atraumatic.  Right Ear: External ear normal.  Left Ear: External ear normal.  Nose: Nose normal.  Cardiovascular: Normal rate and regular rhythm.   Pulmonary/Chest: Effort normal and breath sounds normal.  Abdominal: Soft. Bowel sounds are normal.  Genitourinary:  Visual inspection of the anus reveals what appears to be an internal hemorrhoid that is not thrombosed and generalized proctitis. No mass effect or tenderness.  Skin: Skin is warm and dry.  Psychiatric: Memory, affect and judgment normal.    Lab Results  Component Value Date   WBC 15.6* 10/28/2015   HGB 14.9 10/28/2015   HCT 42.4 10/28/2015   PLT 207 10/28/2015   GLUCOSE 118* 10/28/2015   CHOL 97 12/27/2015   TRIG 124 12/27/2015   HDL 30* 12/27/2015   LDLCALC 42 12/27/2015   TSH 1.47 08/31/2014   HGBA1C 5.6 02/22/2016    CMP     Component Value Date/Time   NA 139 10/28/2015 0358   NA 141 08/31/2014   K 3.4* 10/28/2015 0358   CL 107 10/28/2015 0358   CO2 25 10/28/2015 0358   GLUCOSE 118* 10/28/2015 0358   BUN 19 10/28/2015 0358   BUN 16 08/31/2014   CREATININE 0.90 03/11/2016 1624   CREATININE 1.1 08/31/2014   CALCIUM 8.5* 10/28/2015 0358   PROT 7.3 12/27/2015 1014   ALBUMIN 4.4 12/27/2015 1014   AST 22 12/27/2015 1014   ALT 28 12/27/2015 1014   ALKPHOS 56 12/27/2015 1014   BILITOT 0.7 12/27/2015 1014   GFRNONAA >60 10/28/2015 0358   GFRAA >60 10/28/2015 0358    Assessment and Plan :   1. Internal hemorrhoid Not thrombosed.Refer to surgery as he would like to have this addressed if possible. - hydrocortisone (ANUSOL-HC) 25 MG suppository; Place 1 suppository (25 mg total) rectally 2 (two) times daily.  Dispense: 12 suppository; Refill: 3 - Ambulatory referral to General Surgery  2. Proctocolitis, unspecified complication (HCC)  - hydrocortisone (ANUSOL-HC) 25 MG suppository; Place 1 suppository (25 mg total) rectally 2 (two) times daily.  Dispense: 12 suppository; Refill: 3 I have done the exam and reviewed the above  chart and it is accurate to the best of my knowledge.  Julieanne Manson MD [facility] 04/10/2016 4:09 PM

## 2016-04-10 NOTE — Telephone Encounter (Signed)
Pt is having problems with his hemorrhoids being flared up.  He wants to know if you will send something to [facility] [address]  His call back is [phone]  Thanks Gilbert Coleman

## 2016-04-11 ENCOUNTER — Encounter: Payer: Self-pay | Admitting: *Deleted

## 2016-04-11 MED ORDER — HYDROCORTISONE ACE-PRAMOXINE 2.5-1 % RE CREA: 1.0000 "application " | TOPICAL_CREAM | Freq: Three times a day (TID) | RECTAL | Status: DC

## 2016-04-15 ENCOUNTER — Encounter: Payer: Self-pay | Admitting: *Deleted

## 2016-04-16 ENCOUNTER — Ambulatory Visit (INDEPENDENT_AMBULATORY_CARE_PROVIDER_SITE_OTHER): Payer: 59 | Admitting: General Surgery

## 2016-04-16 ENCOUNTER — Encounter: Payer: Self-pay | Admitting: General Surgery

## 2016-04-16 VITALS — BP 120/68 | HR 64 | Resp 12 | Ht 73.0 in | Wt 232.0 lb

## 2016-04-16 DIAGNOSIS — K625 Hemorrhage of anus and rectum: Secondary | ICD-10-CM

## 2016-04-16 DIAGNOSIS — K6289 Other specified diseases of anus and rectum: Secondary | ICD-10-CM

## 2016-04-16 NOTE — Progress Notes (Signed)
Patient ID: Gilbert Coleman, male   DOB: 10-04-1951, 65 y.o.   MRN: [id number]  Chief Complaint  Patient presents with  . Rectal Problems    HPI Gilbert Coleman is a 65 y.o. male.  Here today for evaluation of hemorrhoids. He states he has had trouble on and off for many years.  He has had some rectal pain "throbbing" for about 2-3 weeks. He states the analpram suppositories and witch hazel has helped slightly.  He reports no dominant change in his bowel function. He holds has noted that his bowel movements are really been like, and has noted no change lately. He denies any "razor blade" symptom with defecation.. He has had some bleeding in the past but not this episode. He has had frequent episodes of hemorrhoid difficulty in the past, but this episode has bee the most tender and unremitting.  He denies any fever or chills. He did report to Dr. Sullivan Lone that he had experienced some prostate symptoms during the same time.   He manages [employer].  HPI  Past Medical History  Diagnosis Date  . CAD (coronary artery disease)     a. 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  . Ischemic cardiomyopathy     a. 10/2015 Echo: EF 40-45%, sev inflat HK, mild MR, mildly dil LA, PASP .  Marland Kitchen Hyperlipidemia   . Morbid obesity (HCC)   . PAF (paroxysmal atrial fibrillation) (HCC)     a. At time of STEMI in setting of recurrent VF/defib/ACLS meds-->Amio.  Marland Kitchen History of chicken pox   . History of measles     Past Surgical History  Procedure Laterality Date  . Cardiac catheterization N/A 10/26/2015    Procedure: Left Heart Cath and Coronary Angiography;  Surgeon: Iran Ouch, MD;  Location: [facility] INVASIVE CV LAB;  Service: Cardiovascular;  Laterality: N/A;  . Cardiac catheterization N/A 10/26/2015    Procedure: Coronary Stent Intervention;   Surgeon: Iran Ouch, MD;  Location: [facility] INVASIVE CV LAB;  Service: Cardiovascular;  Laterality: N/A;  . Cataract extraction Bilateral   . Finger surgery    . Colonoscopy  06/21/2014    Diminutive polyps. Tubular ademona, Hyperplastic polyps. Repeat 06/2019    Family History  Problem Relation Age of Onset  . Heart disease Father   . Heart disease Brother   . Heart disease Brother   . Alzheimer's disease Mother     Social History Social History  Substance Use Topics  . Smoking status: Former Smoker -- 0.25 packs/day for 30 years    Types: Cigars    Quit date: 11/03/2009  . Smokeless tobacco: Never Used  . Alcohol Use: 0.0 oz/week    0 Standard drinks or equivalent per week     Comment: occasional use    Allergies  Allergen Reactions  . Atorvastatin     Other reaction(s): Muscle Pain    Current Outpatient Prescriptions  Medication Sig Dispense Refill  . aspirin 81 MG tablet Take 1 tablet by mouth daily.    . hydrocortisone (ANUSOL-HC) 25 MG suppository Place 1 suppository (25 mg total) rectally 2 (two) times daily. 12 suppository 3  . lisinopril (PRINIVIL,ZESTRIL) 5 MG tablet Take 1 tablet (5 mg total) by mouth daily. 90 tablet 3  . metoprolol tartrate (LOPRESSOR) 25 MG tablet Take 1 tablet (25 mg total) by  mouth 2 (two) times daily. 180 tablet 3  . Misc Natural Products (GLUCOSAMINE CHONDROITIN COMPLX) TABS Take 1 tablet by mouth daily.    . Multiple Vitamin (MULTIVITAMIN WITH MINERALS) TABS tablet Take 1 tablet by mouth daily.    . nitroGLYCERIN (NITROSTAT) 0.4 MG SL tablet Place 1 tablet (0.4 mg total) under the tongue every 5 (five) minutes as needed for chest pain. 25 tablet 3  . rosuvastatin (CRESTOR) 20 MG tablet Take 1 tablet (20 mg total) by mouth daily at 6 PM. 90 tablet 3  . ticagrelor (BRILINTA) 90 MG TABS tablet Take 1 tablet (90 mg total) by mouth 2 (two) times daily. 180 tablet 3   No current facility-administered medications for this visit.     Review of Systems Review of Systems  Constitutional: Negative.   Respiratory: Negative.   Cardiovascular: Negative.     Blood pressure 120/68, pulse 64, resp. rate 12, height 6\' 1"  (1.854 m), weight 232 lb (105.235 kg).  Physical Exam Physical Exam  Constitutional: He is oriented to person, place, and time. He appears well-developed and well-nourished.  HENT:  Mouth/Throat: Oropharynx is clear and moist.  Eyes: Conjunctivae are normal. No scleral icterus.  Neck: Neck supple.  Cardiovascular: Normal rate, regular rhythm and normal heart sounds.   Pulmonary/Chest: Effort normal and breath sounds normal.  Abdominal: Soft. Normal appearance and bowel sounds are normal.  Genitourinary: Rectal exam shows tenderness (diffuse). Rectal exam shows no external hemorrhoid, no internal hemorrhoid, no mass and anal tone normal. Prostate is enlarged (minimal enlargement without bogginess, good preservation of median raphe).     Rectal tear  Lymphadenopathy:    He has no cervical adenopathy.  Neurological: He is alert and oriented to person, place, and time.  Skin: Skin is warm and dry.  Psychiatric: His behavior is normal.    Data Reviewed PCP notes.  Anoscopy was completed and except for the small posterior fissure which was bleeding, no anal pathology was appreciated. A rigid sigmoidoscope could be advanced to 10 cm. While there was stool in the rectal vault, the rectum appeared to distend normally  Colonoscopy report of 06/21/2014 completed by Lynnae Prudeobert Elliott, M.D. was reviewed. Polyps in the rectum and rectosigmoid. Pathology showed a tubular adenoma in the descending colon and hyperplastic polyps in the rectum and rectosigmoid area.    Assessment    Rectal pain, no clear etiology on today's exam.    Plan    The patient has appreciated some improvement with the use of medicated suppositories and topical witch hazel. We'll ask him to continue on twice a day suppositories for  the next 2 weeks and making use of witch hazel as needed for comfort. We'll plan for reassessment at that time and if he remained symptomatic consider pelvic MRI.       PCP:  Mila MerryFisher, Donald Ref Dr Sullivan LoneGilbert This information has been scribed by Dorathy DaftMarsha Hatch RN, BSN,BC.   Earline MayotteByrnett, Banita Lehn W 04/17/2016, 4:04 PM

## 2016-04-16 NOTE — Patient Instructions (Signed)
The patient is aware to call back for any questions or concerns.  

## 2016-04-17 ENCOUNTER — Encounter: Payer: Self-pay | Admitting: General Surgery

## 2016-04-17 ENCOUNTER — Telehealth: Payer: Self-pay | Admitting: *Deleted

## 2016-04-17 DIAGNOSIS — K6289 Other specified diseases of anus and rectum: Secondary | ICD-10-CM

## 2016-04-17 DIAGNOSIS — K648 Other hemorrhoids: Secondary | ICD-10-CM

## 2016-04-17 DIAGNOSIS — K625 Hemorrhage of anus and rectum: Secondary | ICD-10-CM

## 2016-04-17 DIAGNOSIS — K51219 Ulcerative (chronic) proctitis with unspecified complications: Secondary | ICD-10-CM

## 2016-04-17 HISTORY — DX: Other specified diseases of anus and rectum: K62.89

## 2016-04-17 HISTORY — DX: Hemorrhage of anus and rectum: K62.5

## 2016-04-17 MED ORDER — HYDROCORTISONE ACETATE 25 MG RE SUPP: 25.0000 mg | Freq: Two times a day (BID) | RECTAL | Status: DC

## 2016-04-17 MED ORDER — HYDROCORTISONE ACE-PRAMOXINE 2.5-1 % RE CREA: TOPICAL_CREAM | Freq: Two times a day (BID) | RECTAL | Status: DC

## 2016-04-17 NOTE — Telephone Encounter (Signed)
-----   Message from Earline MayotteJeffrey W Byrnett, MD sent at 04/17/2016  4:10 PM EDT ----- Please notify the patient that I reviewed his colonoscopy as well as reviewing his present problems with Dr. Evette CristalSankar. I like him to continue on his twice a day and perhaps suppositories with hydrocortisone and use of witch hazel is needed for perianal comfort for the next 2 weeks. We'll reassess at that time. Also, I like and make use of a fiber supplement of choice as well as Colace twice a day. We'll reassess his symptoms and if he has not had significant improvement consider completing an MRI.  Please arrange for the patient had a CBC in the near future with differential regarding rectal pain. [employer]

## 2016-04-17 NOTE — Telephone Encounter (Signed)
I left pt a message to call office. I have placed the order for Hydrocortisone suppositories the cream and labs (lab form at front desk) already. Please let patient know remain details thanks.

## 2016-04-18 NOTE — Progress Notes (Signed)
This encounter was created in error - please disregard.

## 2016-04-19 LAB — CBC WITH DIFFERENTIAL/PLATELET
BASOS: 1 %
Basophils Absolute: 0.1 10*3/uL (ref 0.0–0.2)
EOS (ABSOLUTE): 0.3 10*3/uL (ref 0.0–0.4)
EOS: 4 %
HEMATOCRIT: 47.3 % (ref 37.5–51.0)
Hemoglobin: 16 g/dL (ref 12.6–17.7)
IMMATURE GRANS (ABS): 0 10*3/uL (ref 0.0–0.1)
IMMATURE GRANULOCYTES: 0 %
LYMPHS: 37 %
Lymphocytes Absolute: 3.3 10*3/uL — ABNORMAL HIGH (ref 0.7–3.1)
MCH: 32 pg (ref 26.6–33.0)
MCHC: 33.8 g/dL (ref 31.5–35.7)
MCV: 95 fL (ref 79–97)
MONOS ABS: 1.1 10*3/uL — AB (ref 0.1–0.9)
Monocytes: 12 %
NEUTROS ABS: 4.3 10*3/uL (ref 1.4–7.0)
NEUTROS PCT: 46 %
Platelets: 239 10*3/uL (ref 150–379)
RBC: 5 x10E6/uL (ref 4.14–5.80)
RDW: 13.1 % (ref 12.3–15.4)
WBC: 9.1 10*3/uL (ref 3.4–10.8)

## 2016-04-24 ENCOUNTER — Ambulatory Visit: Payer: Self-pay | Admitting: General Surgery

## 2016-04-25 ENCOUNTER — Encounter: Payer: Self-pay | Admitting: Family Medicine

## 2016-04-25 DIAGNOSIS — I779 Disorder of arteries and arterioles, unspecified: Secondary | ICD-10-CM | POA: Insufficient documentation

## 2016-04-25 DIAGNOSIS — I739 Peripheral vascular disease, unspecified: Secondary | ICD-10-CM

## 2016-04-30 ENCOUNTER — Encounter: Payer: Self-pay | Admitting: General Surgery

## 2016-04-30 ENCOUNTER — Ambulatory Visit (INDEPENDENT_AMBULATORY_CARE_PROVIDER_SITE_OTHER): Payer: 59 | Admitting: General Surgery

## 2016-04-30 VITALS — BP 120/70 | HR 72 | Resp 14 | Ht 72.0 in | Wt 233.0 lb

## 2016-04-30 DIAGNOSIS — K648 Other hemorrhoids: Secondary | ICD-10-CM

## 2016-04-30 DIAGNOSIS — K602 Anal fissure, unspecified: Secondary | ICD-10-CM | POA: Diagnosis not present

## 2016-04-30 NOTE — Progress Notes (Signed)
Patient ID: Gilbert CrawfordRobert D Notch, male   DOB: 11/04/1950, 65 y.o.   MRN: [id number]  Chief Complaint  Patient presents with  . Follow-up    hemorrhoids    HPI Gilbert Coleman is a 65 y.o. male here today for his two week follow up hemorrhoids. Patient states he is still having an achy pain.The patient reports he had significant pain the day of his last exam, in spite of the use of lidocaine jelly applied 10 minutes before the formal digital exam, anoscopy and limited sigmoidoscopy.  After getting squared away on his past discomfort, he reports significantly improved as opposed to 3 weeks ago. Pain is down, minimal bleeding and no sharp burning pain with defecation. He does find that his stools are easiest to pass he makes use of stool softeners. He has been using the Anusol suppositories once a day rather than twice.  I personally reviewed the patient's history. HPI  Past Medical History  Diagnosis Date  . CAD (coronary artery disease)     a. 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  . Ischemic cardiomyopathy     a. 10/2015 Echo: EF 40-45%, sev inflat HK, mild MR, mildly dil LA, PASP 42mmHg.  Marland Kitchen. Hyperlipidemia   . Morbid obesity (HCC)   . PAF (paroxysmal atrial fibrillation) (HCC)     a. At time of STEMI in setting of recurrent VF/defib/ACLS meds-->Amio.  Marland Kitchen. History of chicken pox   . History of measles     Past Surgical History  Procedure Laterality Date  . Cardiac catheterization N/A 10/26/2015    Procedure: Left Heart Cath and Coronary Angiography;  Surgeon: Iran OuchMuhammad A Arida, MD;  Location: [facility] INVASIVE CV LAB;  Service: Cardiovascular;  Laterality: N/A;  . Cardiac catheterization N/A 10/26/2015    Procedure: Coronary Stent Intervention;  Surgeon: Iran OuchMuhammad A Arida, MD;  Location: [facility] INVASIVE CV LAB;  Service: Cardiovascular;   Laterality: N/A;  . Cataract extraction Bilateral   . Finger surgery    . Colonoscopy  06/21/2014    Diminutive polyps. Tubular ademona, Hyperplastic polyps. Repeat 06/2019    Family History  Problem Relation Age of Onset  . Heart disease Father   . Heart disease Brother   . Heart disease Brother   . Alzheimer's disease Mother     Social History Social History  Substance Use Topics  . Smoking status: Former Smoker -- 0.25 packs/day for 30 years    Types: Cigars    Quit date: 11/03/2009  . Smokeless tobacco: Never Used  . Alcohol Use: 0.0 oz/week    0 Standard drinks or equivalent per week     Comment: occasional use    Allergies  Allergen Reactions  . Atorvastatin     Other reaction(s): Muscle Pain    Current Outpatient Prescriptions  Medication Sig Dispense Refill  . aspirin 81 MG tablet Take 1 tablet by mouth daily.    . hydrocortisone (ANUSOL-HC) 25 MG suppository Place 1 suppository (25 mg total) rectally 2 (two) times daily. 12 suppository 3  . hydrocortisone-pramoxine (ANALPRAM-HC) 2.5-1 % rectal cream Place rectally 2 (two) times daily. 30 g 0  . lisinopril (PRINIVIL,ZESTRIL) 5 MG tablet Take 1 tablet (5 mg total) by mouth daily. 90 tablet 3  . metoprolol tartrate (LOPRESSOR) 25 MG tablet Take 1 tablet (25 mg total) by mouth 2 (two) times  daily. 180 tablet 3  . Misc Natural Products (GLUCOSAMINE CHONDROITIN COMPLX) TABS Take 1 tablet by mouth daily.    . Multiple Vitamin (MULTIVITAMIN WITH MINERALS) TABS tablet Take 1 tablet by mouth daily.    . nitroGLYCERIN (NITROSTAT) 0.4 MG SL tablet Place 1 tablet (0.4 mg total) under the tongue every 5 (five) minutes as needed for chest pain. 25 tablet 3  . rosuvastatin (CRESTOR) 20 MG tablet Take 1 tablet (20 mg total) by mouth daily at 6 PM. 90 tablet 3  . ticagrelor (BRILINTA) 90 MG TABS tablet Take 1 tablet (90 mg total) by mouth 2 (two) times daily. 180 tablet 3   No current facility-administered medications for this  visit.    Review of Systems Review of Systems  Constitutional: Negative.   Respiratory: Negative.   Cardiovascular: Negative.     Blood pressure 120/70, pulse 72, resp. rate 14, height 6' (1.829 m), weight 233 lb (105.688 kg).  Physical Exam Physical Exam  Constitutional: He is oriented to person, place, and time. He appears well-developed and well-nourished.  Cardiovascular: Normal rate, regular rhythm and normal heart sounds.   Pulmonary/Chest: Effort normal and breath sounds normal.  Genitourinary: Rectal exam shows fissure.     Neurological: He is alert and oriented to person, place, and time.  Skin: Skin is warm and dry.      Assessment    Improvement in regards to perianal pain.    Plan    At this time, I encouraged him use a glycerin suppository when he has surgery to defecate, to improve elimination. If he continues to show the improvement over the next few weeks no further intervention would be recommended based on his normal exam with limited sigmoidoscopy and anoscopy last visit. If his symptoms worsen or recur will consider pelvic MRI.     PCP:  Sherrie MustacheFisher This information has been scribed by Ples SpecterJessica Qualls CMA.   Earline MayotteByrnett, Denita Lun W 05/02/2016, 3:05 PM

## 2016-09-15 ENCOUNTER — Other Ambulatory Visit: Payer: Self-pay | Admitting: Cardiovascular Disease

## 2017-02-01 IMAGING — US US CAROTID DUPLEX BILAT
1 series · 13 of 24 positions shown · non-contrast
Comparison: No prior

CLINICAL DATA: 65-year-old male with a history of TIA.

Cardiovascular risk factors include prior myocardial
infarction/coronary artery disease, tobacco exposure, known vascular
disease with prior surgery
EXAM:
BILATERAL CAROTID DUPLEX ULTRASOUND
TECHNIQUE: Gray scale imaging, color Doppler and duplex ultrasound were
performed of bilateral carotid and vertebral arteries in the neck.

[Series 1: us carotid duplex bilat · 0.07mm/px · 13 of 75 slices shown]
[im 1/75]
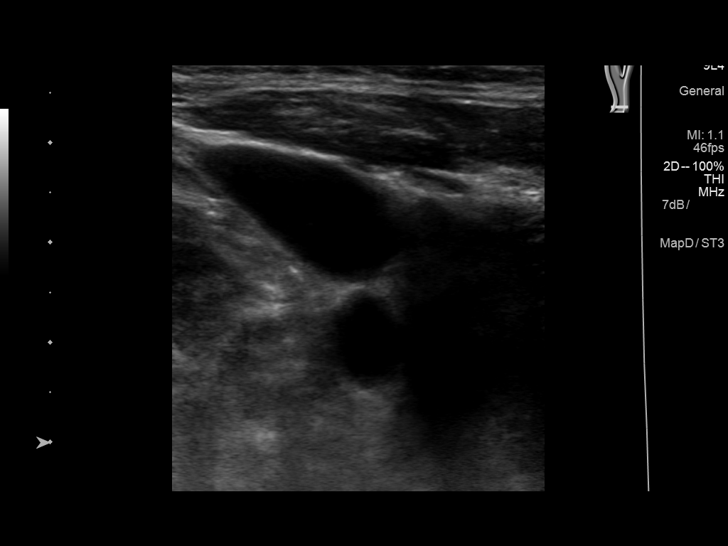
[im 7/75]
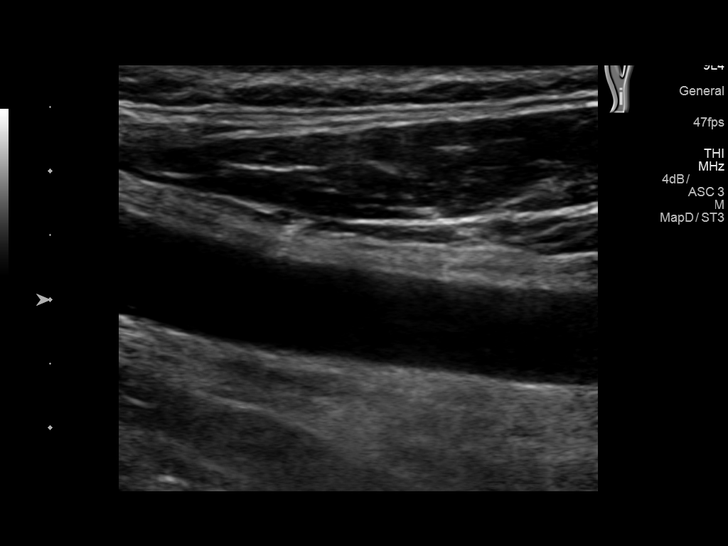
[im 13/75]
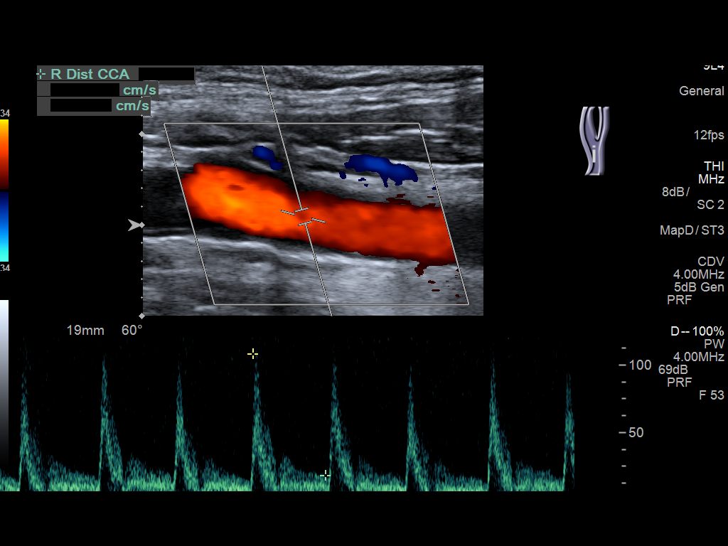
[im 20/75]
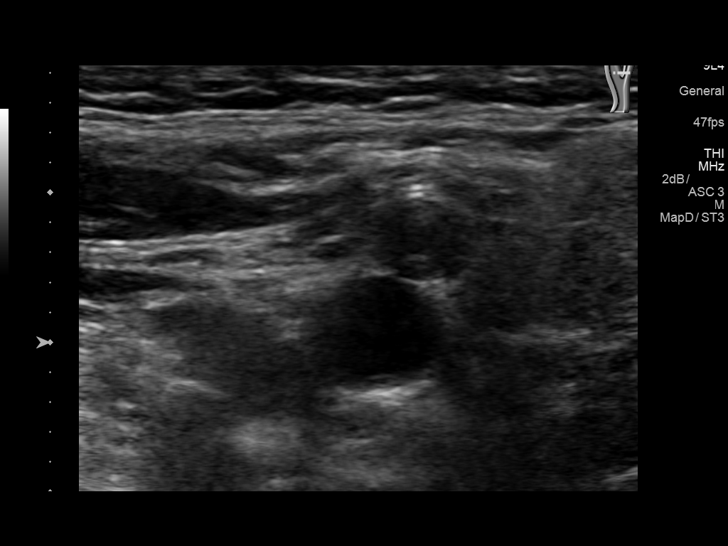
[im 26/75]
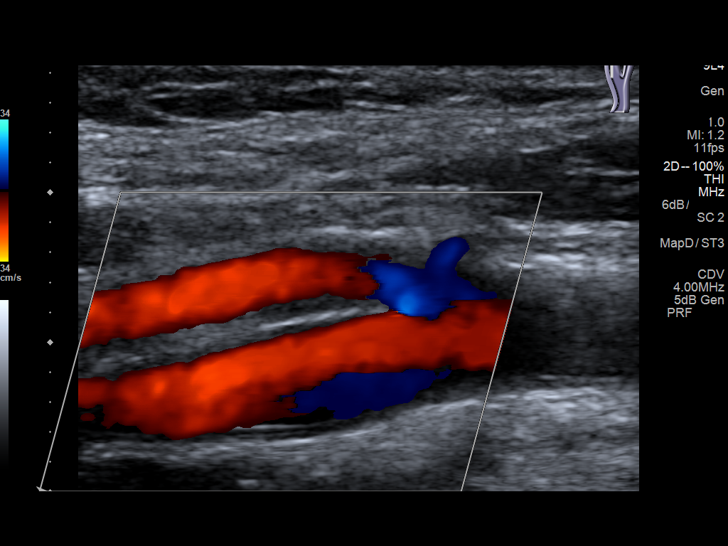
[im 33/75]
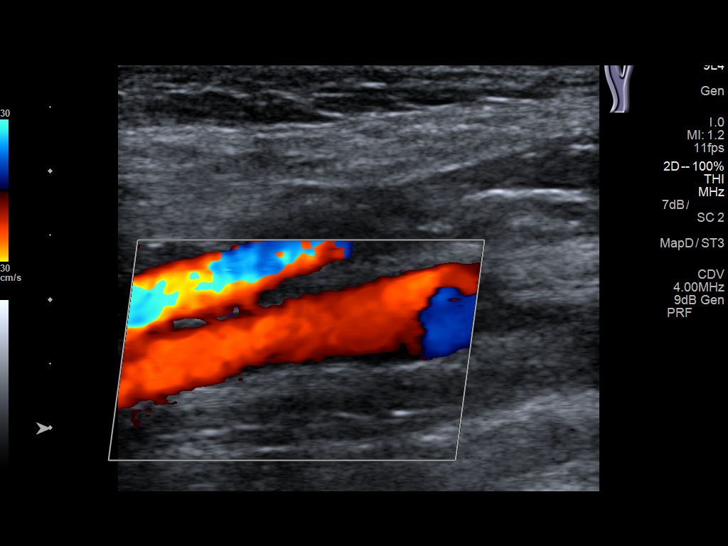
[im 39/75]
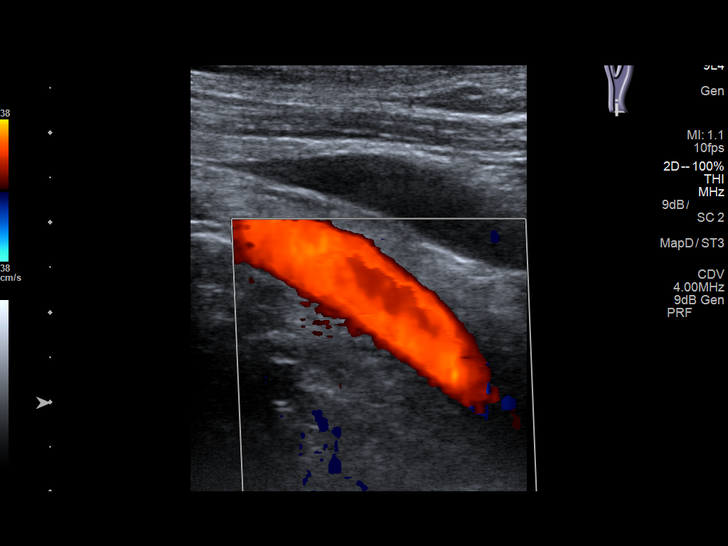
[im 42/75]
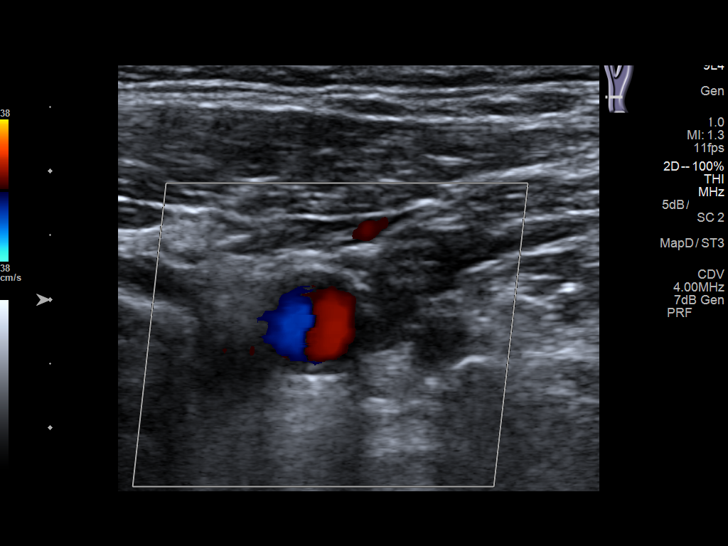
[im 49/75]
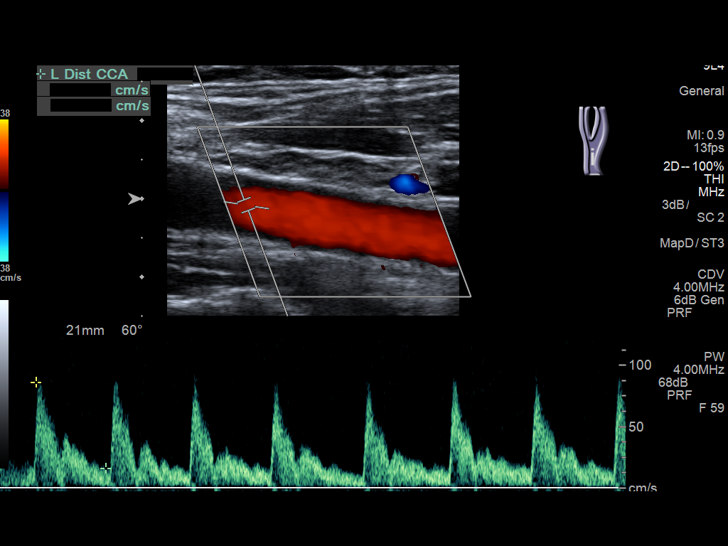
[im 55/75]
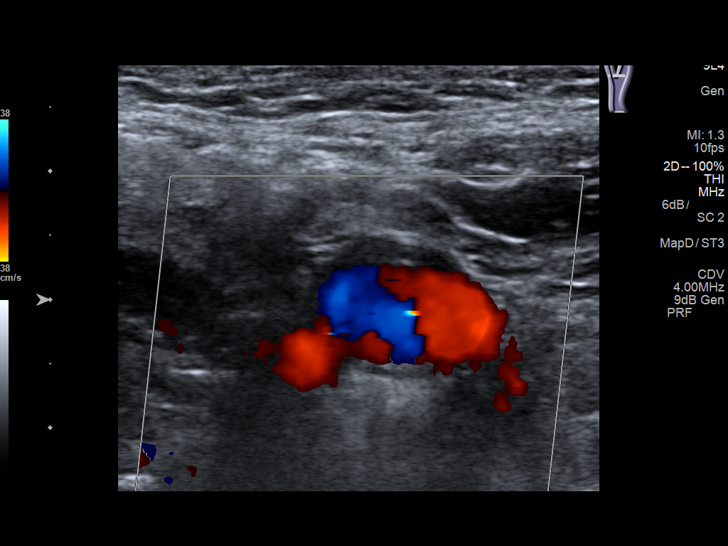
[im 62/75]
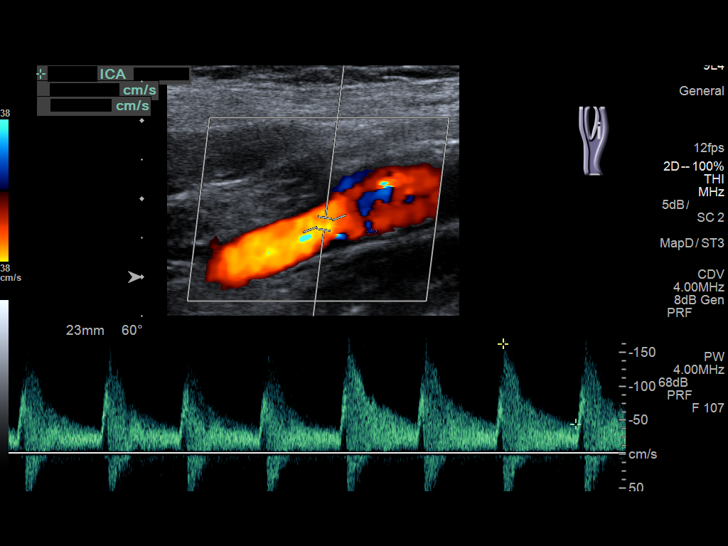
[im 68/75]
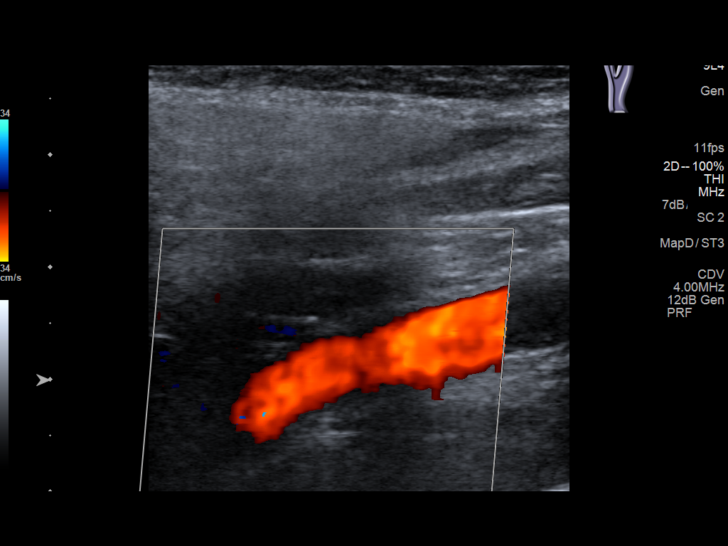
[im 75/75]
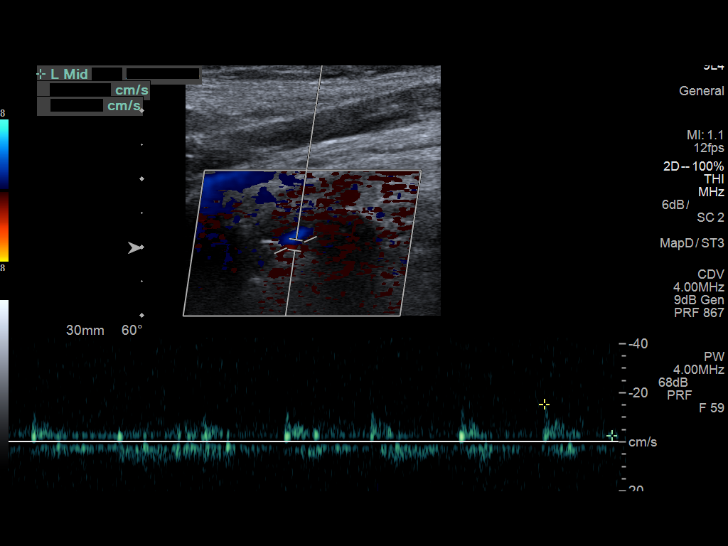

[13 of 24 positions shown; findings below may reference images not displayed]

FINDINGS: Criteria: Quantification of carotid stenosis is based on velocity
parameters that correlate the residual internal carotid diameter
with NASCET-based stenosis levels, using the diameter of the distal
internal carotid lumen as the denominator for stenosis measurement.

The following velocity measurements were obtained:

RIGHT

ICA:  Systolic 87 cm/sec, Diastolic 12 cm/sec

CCA:  113 cm/sec

SYSTOLIC ICA/CCA RATIO:

ECA:  150 cm/sec

LEFT

ICA:  Systolic 163 cm/sec, Diastolic 44 cm/sec

CCA:  119 cm/sec

SYSTOLIC ICA/CCA RATIO:

ECA:  119 cm/sec

Right Brachial SBP: Not acquired

Left Brachial SBP: Not acquired

RIGHT CAROTID ARTERY: No significant calcified disease of the right
common carotid artery. Intermediate waveform maintained.
Heterogeneous plaque without significant calcifications at the right
carotid bifurcation. Low resistance waveform of the right ICA. No
significant tortuosity.

RIGHT VERTEBRAL ARTERY: Antegrade flow with low resistance waveform.

LEFT CAROTID ARTERY: No significant calcified disease of the left
common carotid artery. Intermediate waveform maintained.
Heterogeneous plaque at the left carotid bifurcation without
significant calcifications. Low resistance waveform of the left ICA.

LEFT VERTEBRAL ARTERY: Antegrade systolic flow. Questionable low
flow in the diastolic phase or reversal.
IMPRESSION: Color duplex indicates minimal heterogeneous plaque, with no
hemodynamically significant stenosis by duplex criteria in the
extracranial cerebrovascular circulation.

Right vertebral artery patent with antegrade flow. The left
vertebral artery demonstrates decreased diastolic flow or possible
reversal. If there is concern for left vertebral origin stenosis and
vertebrobasilar symptoms, further evaluation with contrast enhanced
CTA may be useful.

## 2017-03-23 ENCOUNTER — Encounter: Payer: Self-pay | Admitting: Cardiovascular Disease

## 2017-03-23 ENCOUNTER — Ambulatory Visit (INDEPENDENT_AMBULATORY_CARE_PROVIDER_SITE_OTHER): Payer: 59 | Admitting: Cardiovascular Disease

## 2017-03-23 ENCOUNTER — Other Ambulatory Visit: Payer: Self-pay | Admitting: Cardiovascular Disease

## 2017-03-23 VITALS — BP 104/60 | HR 69 | Ht 73.0 in | Wt 248.2 lb

## 2017-03-23 DIAGNOSIS — I2510 Atherosclerotic heart disease of native coronary artery without angina pectoris: Secondary | ICD-10-CM

## 2017-03-23 DIAGNOSIS — I10 Essential (primary) hypertension: Secondary | ICD-10-CM | POA: Diagnosis not present

## 2017-03-23 DIAGNOSIS — E785 Hyperlipidemia, unspecified: Secondary | ICD-10-CM

## 2017-03-23 MED ORDER — TICAGRELOR 60 MG PO TABS: 60.0000 mg | ORAL_TABLET | Freq: Two times a day (BID) | ORAL | 2 refills | Status: DC

## 2017-03-23 NOTE — Progress Notes (Signed)
Cardiology Office Note   Date:  03/23/2017   ID:  Gilbert Coleman, DOB June 23, 1951, MRN [id number]  PCP:  Malva Limes, MD  Cardiologist:   Lorine Bears, MD   Chief Complaint  Patient presents with  . OTHER    1 yr f/u c/o sob and flunctuating BP. Meds reviewed verbally with pt.      History of Present Illness: Gilbert Coleman is a 66 y.o. male who presents for a follow-up visit regarding coronary artery disease. He presented in December 2016 with inferolateral ST elevation myocardial infarction which was complicated by ventricular fibrillation requiring multiple shocks. I proceeded with  emergent diagnostic catheterization which showed severe thrombotic disease in the mid left circumflex with 70% diffuse proximal RCA stenosis. He underwent successful angioplasty and drug-eluting stent placement to the mid left circumflex. He had atrial fibrillation after the procedure which was controlled with amiodarone. Echocardiogram showed an ejection fraction of 40-45% with mild mitral regurgitation.  He underwent a treadmill nuclear stress test in February 2017 which showed possible mild apical ischemia, normal ejection fraction, no ST deviation with exercise and frequent PVCs with exercise. There was no evidence of inferior wall ischemia. He is known to have occluded left vertebral artery with no significant obstructive disease affecting his carotid arteries. He has been doing very well and denies any chest pain, shortness of breath or palpitations. He has been taking his medications regularly. He complains of easy bruising on dual antiplatelet therapy.  Past Medical History:  Diagnosis Date  . CAD (coronary artery disease)    a. LM nl, LAD 58m, 20d, D1/D2 min irregs, LCX 47m (3.0x15 Xience Alpine DES), OM1/2 nl, OM3 30ost, RCA 70p/m, RPDA /RPL1/RPL2 nl, EF 45-50%-->admisison complicated by recurrent VF requiring Defib and amio.  Marland Kitchen History of chicken pox   . History of measles   .  Hyperlipidemia   . Ischemic cardiomyopathy    a. 10/2015 Echo: EF 40-45%, sev inflat HK, mild MR, mildly dil LA, PASP .  . Morbid obesity (HCC)   . PAF (paroxysmal atrial fibrillation) (HCC)    a. At time of STEMI in setting of recurrent VF/defib/ACLS meds-->Amio.    Past Surgical History:  Procedure Laterality Date  . CARDIAC CATHETERIZATION N/A 10/26/2015   Procedure: Left Heart Cath and Coronary Angiography;  Surgeon: Iran Ouch, MD;  Location: [facility] INVASIVE CV LAB;  Service: Cardiovascular;  Laterality: N/A;  . CARDIAC CATHETERIZATION N/A 10/26/2015   Procedure: Coronary Stent Intervention;  Surgeon: Iran Ouch, MD;  Location: [facility] INVASIVE CV LAB;  Service: Cardiovascular;  Laterality: N/A;  . CATARACT EXTRACTION Bilateral   . COLONOSCOPY  06/21/2014   Diminutive polyps. Tubular ademona, Hyperplastic polyps. Repeat 06/2019  . FINGER SURGERY       Current Outpatient Prescriptions  Medication Sig Dispense Refill  . aspirin 81 MG tablet Take 1 tablet by mouth daily.    Marland Kitchen lisinopril (PRINIVIL,ZESTRIL) 5 MG tablet TAKE 1 TABLET BY MOUTH  DAILY 90 tablet 3  . metoprolol tartrate (LOPRESSOR) 25 MG tablet TAKE 1 TABLET BY MOUTH TWO  TIMES DAILY 180 tablet 3  . Misc Natural Products (GLUCOSAMINE CHONDROITIN COMPLX) TABS Take 1 tablet by mouth daily.    . Multiple Vitamin (MULTIVITAMIN WITH MINERALS) TABS tablet Take 1 tablet by mouth daily.    . nitroGLYCERIN (NITROSTAT) 0.4 MG SL tablet Place 1 tablet (0.4 mg total) under the tongue every 5 (five) minutes as needed for chest pain. 25 tablet 3  .  rosuvastatin (CRESTOR) 20 MG tablet TAKE 1 TABLET BY MOUTH  DAILY AT 6PM 90 tablet 3  . ticagrelor (BRILINTA) 60 MG TABS tablet Take 1 tablet (60 mg total) by mouth 2 (two) times daily. 180 tablet 2   No current facility-administered medications for this visit.     Allergies:   Atorvastatin    Social History:  The patient  reports that he quit smoking about 7 years ago.  His smoking use included Cigars. He has a 7.50 pack-year smoking history. He has never used smokeless tobacco. He reports that he drinks alcohol. He reports that he does not use drugs.   Family History:  The patient's family history includes Alzheimer's disease in his mother; Heart disease in his brother, brother, and father.    ROS:  Please see the history of present illness.   Otherwise, review of systems are positive for none.   All other systems are reviewed and negative.    PHYSICAL EXAM: VS:  BP 104/60 (BP Location: Left Arm, Patient Position: Sitting, Cuff Size: Large)   Pulse 69   Ht 6\' 1"  (1.854 m)   Wt 248 lb 4 oz (112.6 kg)   BMI 32.75 kg/m  , BMI Body mass index is 32.75 kg/m. GEN: Well nourished, well developed, in no acute distress  HEENT: normal  Neck: no JVD, carotid bruits, or masses Cardiac: RRR; no murmurs, rubs, or gallops,no edema  Respiratory:  clear to auscultation bilaterally, normal work of breathing GI: soft, nontender, nondistended, + BS MS: no deformity or atrophy  Skin: warm and dry, no rash Neuro:  Strength and sensation are intact Psych: euthymic mood, full affect   EKG:  EKG is ordered today. The ekg ordered today demonstrates sinus bradycardia with no significant ST or T wave changes   Recent Labs: 04/18/2016: Platelets 239    Lipid Panel    Component Value Date/Time   CHOL 97 12/27/2015 1014   TRIG 124 12/27/2015 1014   HDL 30 (L) 12/27/2015 1014   CHOLHDL 3.2 12/27/2015 1014   VLDL 25 12/27/2015 1014   LDLCALC 42 12/27/2015 1014      Wt Readings from Last 3 Encounters:  03/23/17 248 lb 4 oz (112.6 kg)  04/30/16 233 lb (105.7 kg)  04/16/16 232 lb (105.2 kg)      Other studies Reviewed:    ASSESSMENT AND PLAN:  1.  Coronary artery disease involving native coronary arteries without angina: He is doing very well from a cardiac standpoint with no anginal symptoms. I elected to decrease Brilinta to 60 mg twice daily.  2.   Hyperlipidemia:  Most recent LDL was 42. Continue treatment with rosuvastatin. I requested lipid and liver profile today.  3. Ischemic cardiomyopathy: Most recent ejection fraction was normal. Continue small dose metoprolol and lisinopril. I requested basic metabolic profile.  4. Left vertebral artery occlusion: currently asymptomatic.  Disposition:   FU with me in 12 months  Signed,  Lorine BearsMuhammad Janthony Holleman, MD  03/23/2017 4:15 PM    [facility]

## 2017-03-23 NOTE — Patient Instructions (Signed)
Medication Instructions:  Your physician has recommended you make the following change in your medication:  DECREASE brilinta to 60mg  twice daily    Labwork: Lipid[facility] liver and [facility]  Testing/Procedures: none  Follow-Up: Your physician wants you to follow-up in: one year with Dr. Kirke [name].  You will receive a reminder letter in the mail two months in advance. If you don't receive a letter, please call our office to schedule the follow-up appointment.  Any Other Special Instructions Will Be Listed Below (If Applicable).     If you need a refill on your cardiac medications before your next appointment, please call your pharmacy.

## 2017-03-24 LAB — LIPID PANEL W/O CHOL/HDL RATIO
CHOLESTEROL TOTAL: 131 mg/dL (ref 100–199)
HDL: 27 mg/dL — ABNORMAL LOW (ref >39)
TRIGLYCERIDES: 490 mg/dL — AB (ref 0–149)

## 2017-03-24 LAB — HEPATIC FUNCTION PANEL
ALK PHOS: 72 IU/L (ref 39–117)
ALT: 27 IU/L (ref 0–44)
AST: 25 IU/L (ref 0–40)
Albumin: 4.4 g/dL (ref 3.6–4.8)
BILIRUBIN TOTAL: 0.4 mg/dL (ref 0.0–1.2)
BILIRUBIN, DIRECT: 0.12 mg/dL (ref 0.00–0.40)
Total Protein: 6.8 g/dL (ref 6.0–8.5)

## 2017-03-24 LAB — BASIC METABOLIC PANEL
BUN / CREAT RATIO: 18 (ref 10–24)
BUN: 19 mg/dL (ref 8–27)
CHLORIDE: 101 mmol/L (ref 96–106)
CO2: 24 mmol/L (ref 18–29)
Calcium: 9.3 mg/dL (ref 8.6–10.2)
Creatinine, Ser: 1.07 mg/dL (ref 0.76–1.27)
GFR calc Af Amer: 84 mL/min/{1.73_m2} (ref >59)
GFR calc non Af Amer: 73 mL/min/{1.73_m2} (ref >59)
GLUCOSE: 98 mg/dL (ref 65–99)
POTASSIUM: 4.4 mmol/L (ref 3.5–5.2)
SODIUM: 140 mmol/L (ref 134–144)

## 2017-03-31 ENCOUNTER — Other Ambulatory Visit: Payer: Self-pay

## 2017-03-31 DIAGNOSIS — E781 Pure hyperglyceridemia: Secondary | ICD-10-CM

## 2017-03-31 MED ORDER — FISH OIL 1000 MG PO CAPS: 2.0000 | ORAL_CAPSULE | Freq: Every day | ORAL | 0 refills | Status: AC

## 2017-04-09 ENCOUNTER — Telehealth: Payer: Self-pay | Admitting: Cardiovascular Disease

## 2017-04-09 NOTE — Telephone Encounter (Signed)
Left message on pt's home VM of need for lipid, liver, and BMET labs Provided CB number if questions.

## 2017-04-10 NOTE — Telephone Encounter (Signed)
Patient calling to see when he should have labs done. The papers he received in mail state end of august but vm from sharon says she is looking for results.  Please call to confirm when patient should do these

## 2017-04-13 ENCOUNTER — Telehealth: Payer: Self-pay | Admitting: Cardiovascular Disease

## 2017-04-13 NOTE — Telephone Encounter (Signed)
Pt is returning your call

## 2017-04-13 NOTE — Telephone Encounter (Signed)
Pt scheduled for repeat liver and lipid profile in August at [facility]. Orders were mailed to patient in May. Left detailed message on pt's home VM. Provided CB number if any questions.

## 2017-06-08 ENCOUNTER — Other Ambulatory Visit: Payer: Self-pay

## 2017-06-08 ENCOUNTER — Telehealth: Payer: Self-pay | Admitting: Cardiovascular Disease

## 2017-06-08 MED ORDER — TICAGRELOR 60 MG PO TABS: 60.0000 mg | ORAL_TABLET | Freq: Two times a day (BID) | ORAL | 2 refills | Status: DC

## 2017-06-08 NOTE — Telephone Encounter (Signed)
Pt returning call

## 2017-06-08 NOTE — Telephone Encounter (Signed)
Left message on machine for patient to contact the office.   

## 2017-06-08 NOTE — Telephone Encounter (Signed)
Please review medication Brilinta concern.

## 2017-06-08 NOTE — Telephone Encounter (Signed)
°*  STAT* If patient is at the pharmacy, call can be transferred to refill team.   1. Which medications need to be refilled? (please list name of each medication and dose if known) Brilinta   2. Which pharmacy/location (including street and city if local pharmacy) is medication to be sent to? [facility]  3. Do they need a 30 day or 90 day supply? 90 day   He was here last and was advised by Dr Kirke [name] to cut his Brilinta in half But at that point he had already received a 90 day supply  So Dr Kirke [name] advised him when he was done with this last batch to call back and he would need a panel done. He is to get one done on 07/01/17 but the issue is he is out now on medication  He needs advise on this  Please call back

## 2017-06-08 NOTE — Telephone Encounter (Signed)
Pt requests Brilinta 60mg , 90 day supply sent to [facility]. States when he saw Dr. Kirke [name] in May, he had just reordered Brilinta 90mg  and MD was agreeable to continue this dosage until he ran out then to decrease to 60mg .  Brilinta 60mg  [facility]

## 2017-07-02 ENCOUNTER — Other Ambulatory Visit: Payer: Self-pay | Admitting: Cardiovascular Disease

## 2017-07-03 LAB — LIPID PANEL W/O CHOL/HDL RATIO
Cholesterol, Total: 112 mg/dL (ref 100–199)
HDL: 29 mg/dL — AB (ref >39)
LDL Calculated: 40 mg/dL (ref 0–99)
Triglycerides: 213 mg/dL — ABNORMAL HIGH (ref 0–149)
VLDL CHOLESTEROL CAL: 43 mg/dL — AB (ref 5–40)

## 2017-07-13 ENCOUNTER — Ambulatory Visit (INDEPENDENT_AMBULATORY_CARE_PROVIDER_SITE_OTHER): Payer: 59 | Admitting: Vascular Surgery

## 2017-07-13 ENCOUNTER — Encounter (INDEPENDENT_AMBULATORY_CARE_PROVIDER_SITE_OTHER): Payer: 59

## 2017-07-31 IMAGING — CT CT ANGIO NECK
2 of 3 series · 8 of 14 positions shown · IV contrast (isovue)
Comparison: Carotid Doppler 02/28/2016

CLINICAL DATA: TIA. Abnormal left vertebral artery on Doppler
examination.



[Series 4: cta neck · axial · 0.39mm/px · z∈[-251,-157]mm · 2 of 141 slices shown]
[im 47/141  soft-tissue]
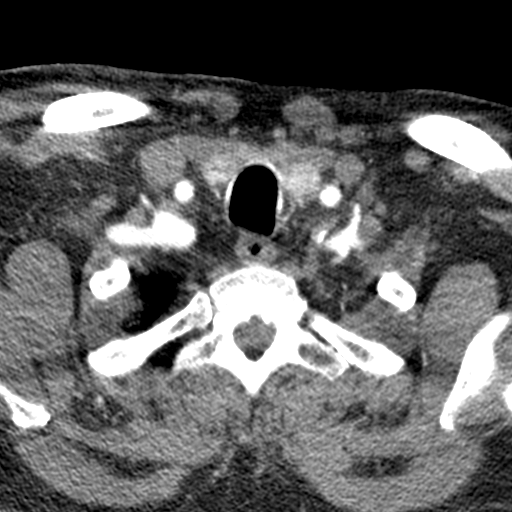
[im 94/141  soft-tissue]
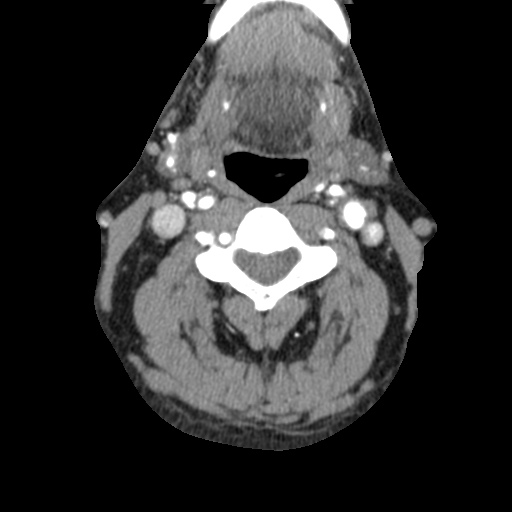

[Series 6: ax thin · axial · 0.39mm/px · z∈[-304,-104]mm · 6 of 280 slices shown]
[im 40/280  soft-tissue]
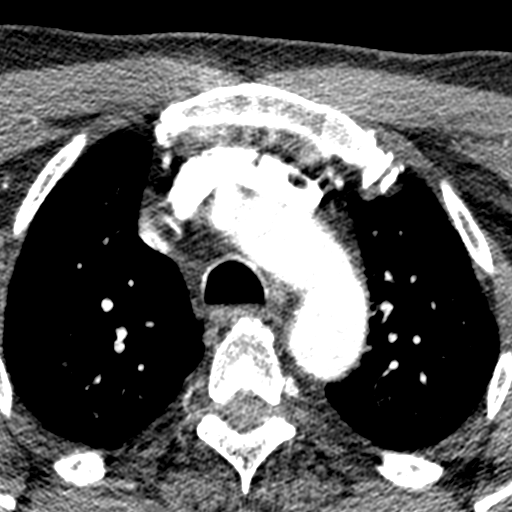
[im 80/280  bone]
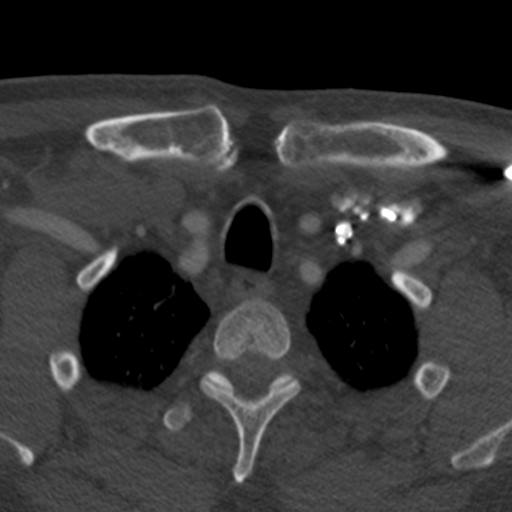
[im 120/280  soft-tissue]
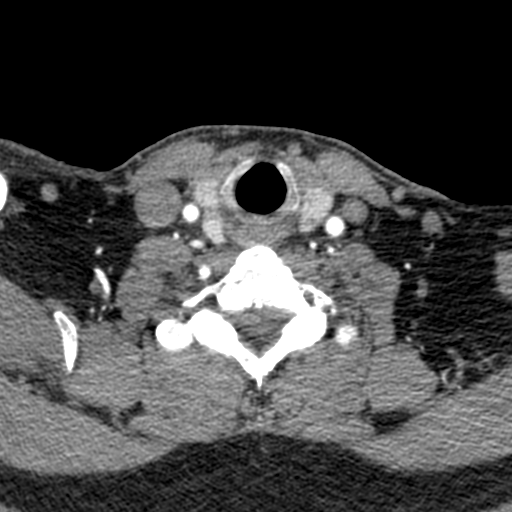
[im 160/280  bone]
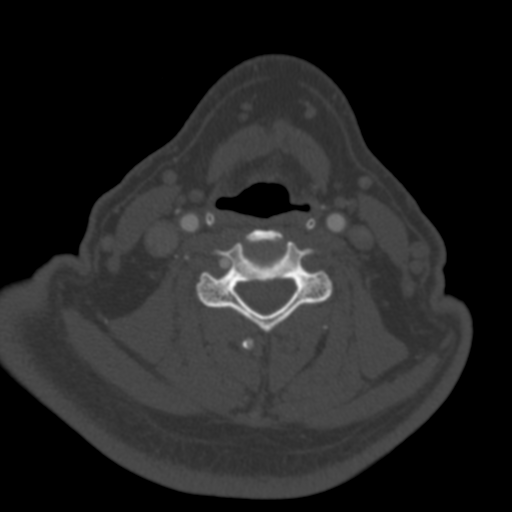
[im 200/280  soft-tissue]
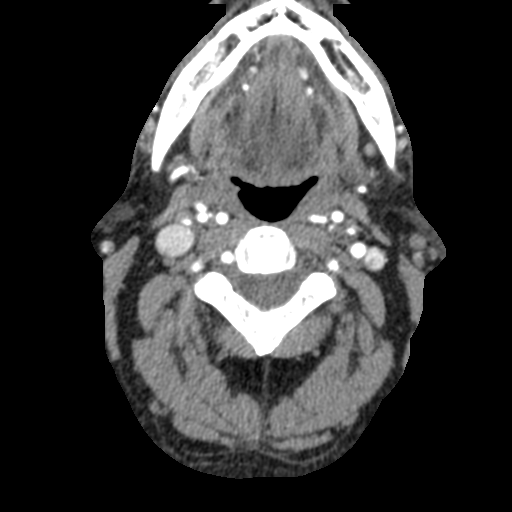
[im 240/280  bone]
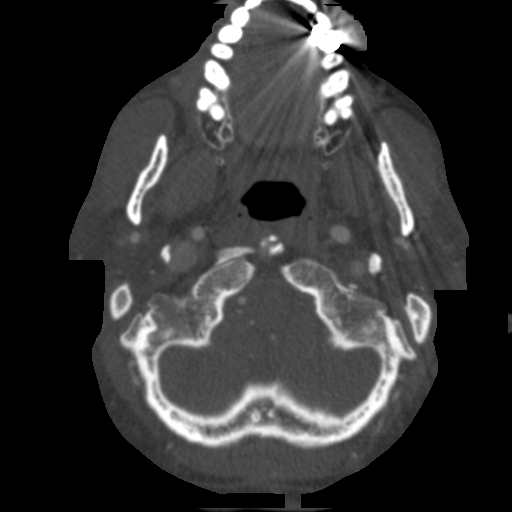

[8 of 14 positions shown; findings below may reference images not displayed]

FINDINGS: Aortic arch: Normal aortic arch. Proximal great vessels widely
patent. Subclavian arteries widely patent bilaterally.

Lung apices clear bilaterally.

Right carotid system: Right common carotid artery normal. Minimal
atherosclerotic disease in the right carotid bifurcation without
significant stenosis.

Left carotid system: Left common carotid artery widely patent. Left
carotid bifurcation widely patent. Noncalcified plaque in the
proximal internal carotid artery just above the carotid bulb
narrowing the lumen by 25% diameter stenosis. No thrombus or
dissection.

Vertebral arteries:Right vertebral artery is dominant and widely
patent to the basilar without stenosis.

Left vertebral artery is occluded proximally. There is a very faint
amount of contrast in a small left vertebral artery which
reconstitutes at approximately C6. This is continuous to the
skullbase but is a very small vessel with minimal flow.

Skeleton: Negative

Other neck: Negative for mass or adenopathy in the neck.
IMPRESSION: Left vertebral artery occluded at the origin with faint
reconstitution of a very small left vertebral artery which continues
to the skullbase. This could be due to atherosclerotic disease or
dissection. Right vertebral artery widely patent and normal

Mild atherosclerotic plaque proximal left internal carotid artery
with 25% diameter stenosis.

No significant right carotid stenosis.

## 2017-10-23 ENCOUNTER — Other Ambulatory Visit: Payer: Self-pay | Admitting: Cardiovascular Disease

## 2018-01-08 ENCOUNTER — Other Ambulatory Visit: Payer: Self-pay | Admitting: Cardiovascular Disease

## 2018-03-01 ENCOUNTER — Telehealth: Payer: Self-pay

## 2018-03-01 ENCOUNTER — Telehealth: Payer: Self-pay | Admitting: Cardiovascular Disease

## 2018-03-01 MED ORDER — METOPROLOL TARTRATE 25 MG PO TABS: 25.0000 mg | ORAL_TABLET | Freq: Two times a day (BID) | ORAL | 0 refills | Status: DC

## 2018-03-01 MED ORDER — LISINOPRIL 5 MG PO TABS: 5.0000 mg | ORAL_TABLET | Freq: Every day | ORAL | 0 refills | Status: DC

## 2018-03-01 MED ORDER — TICAGRELOR 60 MG PO TABS: 60.0000 mg | ORAL_TABLET | Freq: Two times a day (BID) | ORAL | 0 refills | Status: DC

## 2018-03-01 MED ORDER — ROSUVASTATIN CALCIUM 20 MG PO TABS: 20.0000 mg | ORAL_TABLET | Freq: Every day | ORAL | 0 refills | Status: DC

## 2018-03-01 NOTE — Telephone Encounter (Signed)
Spoke with patient and changed his pharmacy preference.  He needed refills for Brilinta. Lisinopril, Metoprolol, and Rosuvastatin.   Sent refills for those in for the patient.  He also stated he has stated taking B12 1000 Daily. He said he has been having numbness in his feet and toes for years and one of his friends said it helped with him.  I told him I would make a note and make the nurses aware.

## 2018-03-01 NOTE — Telephone Encounter (Signed)
Patient calling to make office aware  Insurance has changed and he will no longer use [facility], he has changed to [facility]  Prescriptions will need to be sent to [facility] on [address]

## 2018-04-08 ENCOUNTER — Telehealth: Payer: Self-pay

## 2018-04-08 NOTE — Telephone Encounter (Signed)
l mom to schedule past due f/u 

## 2018-04-08 NOTE — Telephone Encounter (Signed)
-----   Message from Iran OuchMuhammad A Arida, MD sent at 04/07/2018  4:36 PM EDT ----- This patient is due for an office visit.    ----- Message ----- From: SYSTEM Sent: 04/05/2018  12:05 AM To: Iran OuchMuhammad A Arida, MD

## 2018-04-09 NOTE — Telephone Encounter (Signed)
8/29 Gilbert Coleman

## 2018-06-11 ENCOUNTER — Other Ambulatory Visit: Payer: Self-pay | Admitting: Cardiovascular Disease

## 2018-06-14 ENCOUNTER — Telehealth: Payer: Self-pay | Admitting: Cardiovascular Disease

## 2018-06-14 NOTE — Telephone Encounter (Signed)
Patient scheduled for 8/29 with Dr. Kirke [name]

## 2018-06-14 NOTE — Telephone Encounter (Signed)
-----   Message from Festus AloeSharon G Crespo, CMA sent at 06/14/2018  9:11 AM EDT ----- Please contact patient for a follow up appointment.  Thanks, Jasmine DecemberSharon

## 2018-07-01 ENCOUNTER — Ambulatory Visit (INDEPENDENT_AMBULATORY_CARE_PROVIDER_SITE_OTHER): Payer: Medicare Other | Admitting: Cardiovascular Disease

## 2018-07-01 ENCOUNTER — Encounter: Payer: Self-pay | Admitting: Cardiovascular Disease

## 2018-07-01 VITALS — BP 104/68 | HR 60 | Ht 72.5 in | Wt 237.2 lb

## 2018-07-01 DIAGNOSIS — E785 Hyperlipidemia, unspecified: Secondary | ICD-10-CM | POA: Diagnosis not present

## 2018-07-01 DIAGNOSIS — I2510 Atherosclerotic heart disease of native coronary artery without angina pectoris: Secondary | ICD-10-CM

## 2018-07-01 DIAGNOSIS — I10 Essential (primary) hypertension: Secondary | ICD-10-CM | POA: Diagnosis not present

## 2018-07-01 DIAGNOSIS — I255 Ischemic cardiomyopathy: Secondary | ICD-10-CM

## 2018-07-01 MED ORDER — LISINOPRIL 5 MG PO TABS: ORAL_TABLET | ORAL | 3 refills | Status: DC

## 2018-07-01 MED ORDER — TICAGRELOR 60 MG PO TABS: ORAL_TABLET | ORAL | 3 refills | Status: DC

## 2018-07-01 MED ORDER — ROSUVASTATIN CALCIUM 20 MG PO TABS: ORAL_TABLET | ORAL | 3 refills | Status: DC

## 2018-07-01 MED ORDER — METOPROLOL TARTRATE 25 MG PO TABS: ORAL_TABLET | ORAL | 3 refills | Status: DC

## 2018-07-01 MED ORDER — NITROGLYCERIN 0.4 MG SL SUBL: 0.4000 mg | SUBLINGUAL_TABLET | SUBLINGUAL | 3 refills | Status: AC | PRN

## 2018-07-01 NOTE — Patient Instructions (Signed)
Medication Instructions: Your physician recommends that you continue on your current medications as directed. Please refer to the Current Medication list given to you today.  If you need a refill on your cardiac medications before your next appointment, please call your pharmacy.   Labwork: Your provider would like for you to have the following labs today: CBC, BMET, Lipid, and Liver  Follow-Up: Your physician wants you to follow-up in 12 months with Dr. Kirke [name]. You will receive a reminder letter in the mail two months in advance. If you don't receive a letter, please call our office at [phone] to schedule this follow-up appointment.  Thank you for choosing [facility]!

## 2018-07-01 NOTE — Progress Notes (Signed)
Cardiology Office Note   Date:  07/01/2018   ID:  Gilbert CrawfordRobert D Coleman, DOB 09/14/1951, MRN [id number]  PCP:  Malva LimesFisher, Donald E, MD  Cardiologist:   Lorine BearsMuhammad Arida, MD   Chief Complaint  Patient presents with  . other    Patient last seen in 03/2017. Meds reviewed by the pt. verbally. "doing well."       History of Present Illness: Gilbert CrawfordRobert D Coleman is a 67 y.o. male who presents for a follow-up visit regarding coronary artery disease. He presented in December 2016 with inferolateral ST elevation myocardial infarction which was complicated by ventricular fibrillation requiring multiple shocks. I proceeded with  emergent diagnostic catheterization which showed severe thrombotic disease in the mid left circumflex with 70% diffuse proximal RCA stenosis. He underwent successful angioplasty and drug-eluting stent placement to the mid left circumflex. He had atrial fibrillation after the procedure which was controlled with amiodarone. Echocardiogram showed an ejection fraction of 40-45% with mild mitral regurgitation.  He underwent a treadmill nuclear stress test in February 2017 which showed possible mild apical ischemia, normal ejection fraction, no ST deviation with exercise and frequent PVCs with exercise. There was no evidence of inferior wall ischemia. He is known to have occluded left vertebral artery with no significant obstructive disease affecting his carotid arteries.   He has been doing well with no recent chest pain, shortness of breath or palpitations.  He is planning to retire in the next few months.  He has not been exercising regularly.  Past Medical History:  Diagnosis Date  . CAD (coronary artery disease)    a. LM nl, LAD 2635m, 20d, D1/D2 min irregs, LCX 8766m (3.0x15 Xience Alpine DES), OM1/2 nl, OM3 30ost, RCA 70p/m, RPDA /RPL1/RPL2 nl, EF 45-50%-->admisison complicated by recurrent VF requiring Defib and amio.  Marland Kitchen. History of chicken pox   . History of measles   . Hyperlipidemia   .  Ischemic cardiomyopathy    a. 10/2015 Echo: EF 40-45%, sev inflat HK, mild MR, mildly dil LA, PASP 42mmHg.  . Morbid obesity (HCC)   . PAF (paroxysmal atrial fibrillation) (HCC)    a. At time of STEMI in setting of recurrent VF/defib/ACLS meds-->Amio.    Past Surgical History:  Procedure Laterality Date  . CARDIAC CATHETERIZATION N/A 10/26/2015   Procedure: Left Heart Cath and Coronary Angiography;  Surgeon: Iran OuchMuhammad A Arida, MD;  Location: [facility] INVASIVE CV LAB;  Service: Cardiovascular;  Laterality: N/A;  . CARDIAC CATHETERIZATION N/A 10/26/2015   Procedure: Coronary Stent Intervention;  Surgeon: Iran OuchMuhammad A Arida, MD;  Location: [facility] INVASIVE CV LAB;  Service: Cardiovascular;  Laterality: N/A;  . CATARACT EXTRACTION Bilateral   . COLONOSCOPY  06/21/2014   Diminutive polyps. Tubular ademona, Hyperplastic polyps. Repeat 06/2019  . FINGER SURGERY       Current Outpatient Medications  Medication Sig Dispense Refill  . aspirin 81 MG tablet Take 1 tablet by mouth daily.    Marland Kitchen. BRILINTA 60 MG TABS tablet TAKE 1 TABLET(60 MG) BY MOUTH TWICE DAILY 60 tablet 0  . lisinopril (PRINIVIL,ZESTRIL) 5 MG tablet TAKE 1 TABLET(5 MG) BY MOUTH DAILY 30 tablet 0  . metoprolol tartrate (LOPRESSOR) 25 MG tablet TAKE 1 TABLET(25 MG) BY MOUTH TWICE DAILY 60 tablet 0  . Misc Natural Products (GLUCOSAMINE CHONDROITIN COMPLX) TABS Take 1 tablet by mouth daily.    . Multiple Vitamin (MULTIVITAMIN WITH MINERALS) TABS tablet Take 1 tablet by mouth daily.    . nitroGLYCERIN (NITROSTAT) 0.4 MG SL tablet Place  1 tablet (0.4 mg total) under the tongue every 5 (five) minutes as needed for chest pain. 25 tablet 3  . Omega-3 Fatty Acids (FISH OIL) 1000 MG CAPS Take 2 capsules (2,000 mg total) by mouth daily. 60 capsule 0  . rosuvastatin (CRESTOR) 20 MG tablet TAKE 1 TABLET(20 MG) BY MOUTH DAILY 30 tablet 0   No current facility-administered medications for this visit.     Allergies:   Atorvastatin    Social History:   The patient  reports that he quit smoking about 8 years ago. His smoking use included cigars. He has a 7.50 pack-year smoking history. He has never used smokeless tobacco. He reports that he drinks alcohol. He reports that he does not use drugs.   Family History:  The patient's family history includes Alzheimer's disease in his mother; Heart disease in his brother, brother, and father.    ROS:  Please see the history of present illness.   Otherwise, review of systems are positive for none.   All other systems are reviewed and negative.    PHYSICAL EXAM: VS:  BP 104/68 (BP Location: Left Arm, Patient Position: Sitting, Cuff Size: Large)   Pulse 60   Ht 6' 0.5" (1.842 m)   Wt 237 lb 4 oz (107.6 kg)   BMI 31.73 kg/m  , BMI Body mass index is 31.73 kg/m. GEN: Well nourished, well developed, in no acute distress  HEENT: normal  Neck: no JVD, carotid bruits, or masses Cardiac: RRR; no murmurs, rubs, or gallops,no edema  Respiratory:  clear to auscultation bilaterally, normal work of breathing GI: soft, nontender, nondistended, + BS MS: no deformity or atrophy  Skin: warm and dry, no rash Neuro:  Strength and sensation are intact Psych: euthymic mood, full affect   EKG:  EKG is ordered today. The ekg ordered today demonstrates normal sinus rhythm with no significant ST or T wave changes.   Recent Labs: No results found for requested labs within last 8760 hours.    Lipid Panel    Component Value Date/Time   CHOL 112 07/02/2017 1021   TRIG 213 (H) 07/02/2017 1021   HDL 29 (L) 07/02/2017 1021   CHOLHDL 3.2 12/27/2015 1014   VLDL 25 12/27/2015 1014   LDLCALC 40 07/02/2017 1021      Wt Readings from Last 3 Encounters:  07/01/18 237 lb 4 oz (107.6 kg)  03/23/17 248 lb 4 oz (112.6 kg)  04/30/16 233 lb (105.7 kg)      Other studies Reviewed:    ASSESSMENT AND PLAN:  1.  Coronary artery disease involving native coronary arteries without angina: He is doing very well from  a cardiac standpoint with no anginal symptoms.   2.  Hyperlipidemia:  Most recent LDL was 42. Continue treatment with rosuvastatin.  I requested lipid and liver profile today.  He is known to have elevated triglyceride.  If his triglyceride is still elevated, I plan on switching him from over-the-counter fish oil to Vascepa  3. Ischemic cardiomyopathy: Most recent ejection fraction was normal. Continue small dose metoprolol and lisinopril. I requested basic metabolic profile.  4. Left vertebral artery occlusion: currently asymptomatic.  I advised the patient to schedule a routine follow-up with his primary care physician as he has not seen him since 2017.  Disposition:   FU with me in 12 months  Signed,  Lorine Bears, MD  07/01/2018 8:28 AM    [facility]

## 2018-07-02 ENCOUNTER — Telehealth: Payer: Self-pay | Admitting: *Deleted

## 2018-07-02 LAB — BASIC METABOLIC PANEL
BUN / CREAT RATIO: 18 (ref 10–24)
BUN: 14 mg/dL (ref 8–27)
CHLORIDE: 104 mmol/L (ref 96–106)
CO2: 21 mmol/L (ref 20–29)
CREATININE: 0.8 mg/dL (ref 0.76–1.27)
Calcium: 9.5 mg/dL (ref 8.6–10.2)
GFR calc Af Amer: 108 mL/min/{1.73_m2} (ref >59)
GFR calc non Af Amer: 94 mL/min/{1.73_m2} (ref >59)
GLUCOSE: 97 mg/dL (ref 65–99)
POTASSIUM: 4.7 mmol/L (ref 3.5–5.2)
SODIUM: 141 mmol/L (ref 134–144)

## 2018-07-02 LAB — CBC
Hematocrit: 46.4 % (ref 37.5–51.0)
Hemoglobin: 15.7 g/dL (ref 13.0–17.7)
MCH: 31.3 pg (ref 26.6–33.0)
MCHC: 33.8 g/dL (ref 31.5–35.7)
MCV: 92 fL (ref 79–97)
PLATELETS: 243 10*3/uL (ref 150–450)
RBC: 5.02 x10E6/uL (ref 4.14–5.80)
RDW: 14.1 % (ref 12.3–15.4)
WBC: 7.9 10*3/uL (ref 3.4–10.8)

## 2018-07-02 LAB — HEPATIC FUNCTION PANEL
ALT: 24 IU/L (ref 0–44)
AST: 20 IU/L (ref 0–40)
Albumin: 4.5 g/dL (ref 3.6–4.8)
Alkaline Phosphatase: 74 IU/L (ref 39–117)
BILIRUBIN TOTAL: 0.5 mg/dL (ref 0.0–1.2)
Bilirubin, Direct: 0.17 mg/dL (ref 0.00–0.40)
TOTAL PROTEIN: 6.7 g/dL (ref 6.0–8.5)

## 2018-07-02 LAB — LIPID PANEL
Chol/HDL Ratio: 4 ratio (ref 0.0–5.0)
Cholesterol, Total: 113 mg/dL (ref 100–199)
HDL: 28 mg/dL — ABNORMAL LOW (ref >39)
LDL CALC: 28 mg/dL (ref 0–99)
Triglycerides: 286 mg/dL — ABNORMAL HIGH (ref 0–149)
VLDL CHOLESTEROL CAL: 57 mg/dL — AB (ref 5–40)

## 2018-07-02 MED ORDER — ICOSAPENT ETHYL 1 G PO CAPS: 2.0000 g | ORAL_CAPSULE | Freq: Two times a day (BID) | ORAL | 11 refills | Status: DC

## 2018-07-02 NOTE — Telephone Encounter (Signed)
Patient made aware of results and verbalized understanding.  Vascepa 2 g bid has been sent into the pharmacy.

## 2018-07-02 NOTE — Telephone Encounter (Signed)
-----   Message from Iran OuchMuhammad A Arida, MD sent at 07/02/2018  1:34 PM EDT ----- Inform patient that labs were normal. Cholesterol was excellent but triglyceride is still elevated at 286.  I recommend starting Vascepa 2 g twice daily.  If this is approved and he starts taking it, he can stop taking over-the-counter fish .

## 2018-07-08 NOTE — Telephone Encounter (Signed)
Pt c/o medication issue:  1. Name of Medication: Vascepa (patient not sure of name, was prescribed last week)  2. How are you currently taking this medication (dosage and times per day)?   3. Are you having a reaction (difficulty breathing--STAT)?   4. What is your medication issue? Patient went to pick up medication but it was too expensive.  Patient would like to discuss other options.

## 2018-07-08 NOTE — Telephone Encounter (Signed)
Returned the call to the patient. The Vascepa was too expensive for him and he would like to try the Mega Red Maximum. He stated that Mega Red Maximum is similar in ingredients to the Vascepa. If approved he would like to come back in 6 months for fasting labs.

## 2018-07-09 NOTE — Telephone Encounter (Signed)
That is fine 

## 2018-07-13 MED ORDER — LISINOPRIL 5 MG PO TABS: ORAL_TABLET | ORAL | 3 refills | Status: DC

## 2018-07-13 MED ORDER — METOPROLOL TARTRATE 25 MG PO TABS: ORAL_TABLET | ORAL | 3 refills | Status: DC

## 2018-07-13 MED ORDER — TICAGRELOR 60 MG PO TABS: ORAL_TABLET | ORAL | 3 refills | Status: DC

## 2018-07-13 MED ORDER — ROSUVASTATIN CALCIUM 20 MG PO TABS: ORAL_TABLET | ORAL | 3 refills | Status: DC

## 2018-07-13 NOTE — Addendum Note (Signed)
Addended by: Sandi Mariscal on: 07/13/2018 08:45 AM   Modules accepted: Orders

## 2018-07-13 NOTE — Telephone Encounter (Signed)
Patient has been made aware of Dr. Jari Sportsman recommendations.  Refills have been sent into [facility] pharmacy. The patient stated that he switched pharmacies and did not pick up his previous ones at [facility].

## 2019-07-16 ENCOUNTER — Other Ambulatory Visit: Payer: Self-pay | Admitting: Cardiovascular Disease

## 2019-10-21 ENCOUNTER — Telehealth: Payer: Self-pay | Admitting: Cardiovascular Disease

## 2019-10-21 NOTE — Telephone Encounter (Signed)
   [facility] Pre-operative Risk Assessment    Request for surgical clearance:  1. What type of surgery is being performed? Colonoscopy   2. When is this surgery scheduled? 02/13/20  3. What type of clearance is required (medical clearance vs. Pharmacy clearance to hold med vs. Both)? both  4. Are there any medications that need to be held prior to surgery and how long? Brilinta - instructions on how many days to discontinue prior to procedure   5. Practice name and name of physician performing surgery? [facility] - Gastroenterology, Dr Madolyn Frieze / Octavia Bruckner, PA  6. What is your office phone number [phone]    7.   What is your office fax number [phone]  8.   Anesthesia type (None, local, MAC, general) ? Monitored    Gilbert Coleman 10/21/2019, 2:43 PM  _________________________________________________________________   (provider comments below)

## 2019-10-21 NOTE — Telephone Encounter (Signed)
Pt agreeable to appt for pre op clearance.

## 2019-10-21 NOTE — Telephone Encounter (Signed)
I will forward clearance notes to Dr. Fletcher Anon. I will remove from the pre op call back pool.

## 2019-10-21 NOTE — Telephone Encounter (Signed)
   Primary Cardiologist:Muhammad Fletcher Anon, MD  Chart reviewed as part of pre-operative protocol coverage. Because of Gilbert Coleman's past medical history and time since last visit, he/she will require a follow-up visit in order to better assess preoperative cardiovascular risk.  His last office visit was 07/01/2018 with Dr. Fletcher Anon and he was recommended for 1 year follow up at that time.   Additionally, his colonoscopy is not scheduled until 02/13/2020.  Pre-op covering staff: - Please schedule appointment and call patient to inform them. - Please contact requesting surgeon's office via preferred method (i.e, phone, fax) to inform them of need for appointment prior to surgery.  [name], NP  10/21/2019, 3:11 PM

## 2019-12-20 ENCOUNTER — Encounter: Payer: Self-pay | Admitting: Cardiovascular Disease

## 2019-12-20 ENCOUNTER — Other Ambulatory Visit: Payer: Self-pay

## 2019-12-20 ENCOUNTER — Ambulatory Visit (INDEPENDENT_AMBULATORY_CARE_PROVIDER_SITE_OTHER): Payer: Medicare Other | Admitting: Cardiovascular Disease

## 2019-12-20 VITALS — BP 90/64 | HR 71 | Ht 73.0 in | Wt 241.8 lb

## 2019-12-20 DIAGNOSIS — E785 Hyperlipidemia, unspecified: Secondary | ICD-10-CM

## 2019-12-20 DIAGNOSIS — I255 Ischemic cardiomyopathy: Secondary | ICD-10-CM

## 2019-12-20 DIAGNOSIS — I2510 Atherosclerotic heart disease of native coronary artery without angina pectoris: Secondary | ICD-10-CM

## 2019-12-20 DIAGNOSIS — I5022 Chronic systolic (congestive) heart failure: Secondary | ICD-10-CM

## 2019-12-20 NOTE — Patient Instructions (Signed)
Medication Instructions:  Your physician has recommended you make the following change in your medication:   STOP Brilinta  Continue all other medications as prescribed.  *If you need a refill on your cardiac medications before your next appointment, please call your pharmacy*  Lab Work: Your physician recommends that you return for a FASTING lipid profile, cbc, cmet: same days a your echo  If you have labs (blood work) drawn today and your tests are completely normal, you will receive your results only by: Marland Kitchen MyChart Message (if you have MyChart) OR . A paper copy in the mail If you have any lab test that is abnormal or we need to change your treatment, we will call you to review the results.  Testing/Procedures: Your physician has requested that you have an echocardiogram. Echocardiography is a painless test that uses sound waves to create images of your heart. It provides your doctor with information about the size and shape of your heart and how well your heart's chambers and valves are working. This procedure takes approximately one hour. There are no restrictions for this procedure.    Follow-Up: At [facility], you and your health needs are our priority.  As part of our continuing mission to provide you with exceptional heart care, we have created designated Provider Care Teams.  These Care Teams include your primary Cardiologist (physician) and Advanced Practice Providers (APPs -  Physician Assistants and Nurse Practitioners) who all work together to provide you with the care you need, when you need it.  Your next appointment:   12 month(s)  The format for your next appointment:   In Person  Provider:    You may see Lorine Bears, MD or one of the following Advanced Practice Providers on your designated Care Team:    Nicolasa Ducking, NP  Eula Listen, PA-C  Marisue Ivan, PA-C   Other Instructions  Echocardiogram An echocardiogram is a procedure that uses  painless sound waves (ultrasound) to produce an image of the heart. Images from an echocardiogram can provide important information about:  Signs of coronary artery disease (CAD).  Aneurysm detection. An aneurysm is a weak or damaged part of an artery wall that bulges out from the normal force of blood pumping through the body.  Heart size and shape. Changes in the size or shape of the heart can be associated with certain conditions, including heart failure, aneurysm, and CAD.  Heart muscle function.  Heart valve function.  Signs of a past heart attack.  Fluid buildup around the heart.  Thickening of the heart muscle.  A tumor or infectious growth around the heart valves. Tell a health care provider about:  Any allergies you have.  All medicines you are taking, including vitamins, herbs, eye drops, creams, and over-the-counter medicines.  Any blood disorders you have.  Any surgeries you have had.  Any medical conditions you have.  Whether you are pregnant or may be pregnant. What are the risks? Generally, this is a safe procedure. However, problems may occur, including:  Allergic reaction to dye (contrast) that may be used during the procedure. What happens before the procedure? No specific preparation is needed. You may eat and drink normally. What happens during the procedure?   An IV tube may be inserted into one of your veins.  You may receive contrast through this tube. A contrast is an injection that improves the quality of the pictures from your heart.  A gel will be applied to your chest.  A  wand-like tool (transducer) will be moved over your chest. The gel will help to transmit the sound waves from the transducer.  The sound waves will harmlessly bounce off of your heart to allow the heart images to be captured in real-time motion. The images will be recorded on a computer. The procedure may vary among health care providers and hospitals. What happens after  the procedure?  You may return to your normal, everyday life, including diet, activities, and medicines, unless your health care provider tells you not to do that. Summary  An echocardiogram is a procedure that uses painless sound waves (ultrasound) to produce an image of the heart.  Images from an echocardiogram can provide important information about the size and shape of your heart, heart muscle function, heart valve function, and fluid buildup around your heart.  You do not need to do anything to prepare before this procedure. You may eat and drink normally.  After the echocardiogram is completed, you may return to your normal, everyday life, unless your health care provider tells you not to do that. This information is not intended to replace advice given to you by your health care provider. Make sure you discuss any questions you have with your health care provider. Document Revised: 02/10/2019 Document Reviewed: 11/22/2016 Elsevier Patient Education  [place].

## 2019-12-20 NOTE — Progress Notes (Signed)
Cardiology Office Note   Date:  12/20/2019   ID:  Gilbert Coleman, DOB 11/16/1950, MRN [id number]  PCP:  Malva Limes, MD  Cardiologist:   Lorine Bears, MD   Chief Complaint  Patient presents with  . OTHER    Cardiac clearance pt would like to discuss Brilinta options due to cost. Meds reviewed verbally with pt.      History of Present Illness: Gilbert Coleman is a 69 y.o. male who presents for a follow-up visit regarding coronary artery disease. He presented in December 2016 with inferolateral ST elevation myocardial infarction which was complicated by ventricular fibrillation requiring multiple shocks. Emergent cardiac cath showed severe thrombotic disease in the mid left circumflex with 70% diffuse proximal RCA stenosis. He underwent successful angioplasty and drug-eluting stent placement to the mid left circumflex.  Echocardiogram showed an ejection fraction of 40-45% with mild mitral regurgitation.  He had brief post MI atrial fibrillation with no evidence of recurrent arrhythmia.  He underwent a treadmill nuclear stress test in February 2017 which showed possible mild apical ischemia, normal ejection fraction, no ST deviation with exercise and frequent PVCs with exercise. There was no evidence of inferior wall ischemia. He is known to have occluded left vertebral artery with no significant obstructive disease affecting his carotid arteries.   He has been doing well with no recent chest pain, shortness of breath or palpitations.  He needs to have routine colonoscopy done in the near future.  He takes his medications regularly.  He could not afford Vascepa due to cost and he takes over-the-counter fish oil.  Past Medical History:  Diagnosis Date  . CAD (coronary artery disease)    a. LM nl, LAD 66m, 20d, D1/D2 min irregs, LCX 43m (3.0x15 Xience Alpine DES), OM1/2 nl, OM3 30ost, RCA 70p/m, RPDA /RPL1/RPL2 nl, EF 45-50%-->admisison complicated by recurrent VF requiring Defib and  amio.  Marland Kitchen History of chicken pox   . History of measles   . Hyperlipidemia   . Ischemic cardiomyopathy    a. 10/2015 Echo: EF 40-45%, sev inflat HK, mild MR, mildly dil LA, PASP .  . Morbid obesity (HCC)   . PAF (paroxysmal atrial fibrillation) (HCC)    a. At time of STEMI in setting of recurrent VF/defib/ACLS meds-->Amio.    Past Surgical History:  Procedure Laterality Date  . CARDIAC CATHETERIZATION N/A 10/26/2015   Procedure: Left Heart Cath and Coronary Angiography;  Surgeon: Iran Ouch, MD;  Location: [facility] INVASIVE CV LAB;  Service: Cardiovascular;  Laterality: N/A;  . CARDIAC CATHETERIZATION N/A 10/26/2015   Procedure: Coronary Stent Intervention;  Surgeon: Iran Ouch, MD;  Location: [facility] INVASIVE CV LAB;  Service: Cardiovascular;  Laterality: N/A;  . CATARACT EXTRACTION Bilateral   . COLONOSCOPY  06/21/2014   Diminutive polyps. Tubular ademona, Hyperplastic polyps. Repeat 06/2019  . FINGER SURGERY       Current Outpatient Medications  Medication Sig Dispense Refill  . aspirin 81 MG tablet Take 1 tablet by mouth daily.    Marland Kitchen BRILINTA 60 MG TABS tablet TAKE ONE TABLET TWICE DAILY 180 tablet 3  . lisinopril (ZESTRIL) 5 MG tablet TAKE ONE TABLET EVERY DAY 90 tablet 3  . metoprolol tartrate (LOPRESSOR) 25 MG tablet TAKE ONE TABLET TWICE DAILY 180 tablet 3  . Misc Natural Products (GLUCOSAMINE CHONDROITIN COMPLX) TABS Take 1 tablet by mouth daily.    . Multiple Vitamin (MULTIVITAMIN WITH MINERALS) TABS tablet Take 1 tablet by mouth daily.    Marland Kitchen  nitroGLYCERIN (NITROSTAT) 0.4 MG SL tablet Place 1 tablet (0.4 mg total) under the tongue every 5 (five) minutes as needed for chest pain. 25 tablet 3  . Omega-3 Fatty Acids (FISH OIL) 1000 MG CAPS Take 2 capsules (2,000 mg total) by mouth daily. 60 capsule 0  . rosuvastatin (CRESTOR) 20 MG tablet TAKE ONE TABLET EVERY DAY 90 tablet 3   No current facility-administered medications for this visit.    Allergies:    Atorvastatin    Social History:  The patient  reports that he quit smoking about 10 years ago. His smoking use included cigars. He has a 7.50 pack-year smoking history. He has never used smokeless tobacco. He reports current alcohol use. He reports that he does not use drugs.   Family History:  The patient's family history includes Alzheimer's disease in his mother; Heart disease in his brother, brother, and father.    ROS:  Please see the history of present illness.   Otherwise, review of systems are positive for none.   All other systems are reviewed and negative.    PHYSICAL EXAM: VS:  BP 90/64 (BP Location: Left Arm, Patient Position: Sitting, Cuff Size: Normal)   Pulse 71   Ht 6\' 1"  (1.854 m)   Wt 241 lb 12 oz (109.7 kg)   SpO2 98%   BMI 31.90 kg/m  , BMI Body mass index is 31.9 kg/m. GEN: Well nourished, well developed, in no acute distress  HEENT: normal  Neck: no JVD, carotid bruits, or masses Cardiac: RRR; no murmurs, rubs, or gallops,no edema  Respiratory:  clear to auscultation bilaterally, normal work of breathing GI: soft, nontender, nondistended, + BS MS: no deformity or atrophy  Skin: warm and dry, no rash Neuro:  Strength and sensation are intact Psych: euthymic mood, full affect   EKG:  EKG is ordered today. The ekg ordered today demonstrates normal sinus rhythm with no significant ST or T wave changes.   Recent Labs: No results found for requested labs within last 8760 hours.    Lipid Panel    Component Value Date/Time   CHOL 113 07/01/2018 0848   TRIG 286 (H) 07/01/2018 0848   HDL 28 (L) 07/01/2018 0848   CHOLHDL 4.0 07/01/2018 0848   CHOLHDL 3.2 12/27/2015 1014   VLDL 25 12/27/2015 1014   LDLCALC 28 07/01/2018 0848      Wt Readings from Last 3 Encounters:  12/20/19 241 lb 12 oz (109.7 kg)  07/01/18 237 lb 4 oz (107.6 kg)  03/23/17 248 lb 4 oz (112.6 kg)      Other studies Reviewed:    ASSESSMENT AND PLAN:  1.  Coronary artery  disease involving native coronary arteries without angina: He is doing very well from a cardiac standpoint with no anginal symptoms.  It has been more than 4 years since his myocardial infarction and drug-eluting stent placement.  Thus, I elected to discontinue Brilinta.  Continue aspirin indefinitely.  2.  Hyperlipidemia:  Most recent LDL was 42. Continue treatment with rosuvastatin.  He is known to have elevated triglyceride but could not afford Vascepa.  He is currently taking over-the-counter fish oil.  I requested lipid and liver profile.  3. Ischemic cardiomyopathy: His ejection fraction was 40 to 45% shortly after his myocardial infarction with no recent echocardiogram.  I requested a repeat echocardiogram to evaluate his EF. Initially, his blood pressure was low but I checked it myself at the end of the visit and his blood pressure was 130/70.  4. Left vertebral artery occlusion: currently asymptomatic.  5.  Preop for colonoscopy: The patient has no anginal symptoms should be considered at low risk.  Aspirin 81 mg once daily should be continued without interruption.   I requested routine labs on him.   Disposition:   FU with me in 12 months  Signed,  Lorine Bears, MD  12/20/2019 4:40 PM    [facility]

## 2019-12-22 ENCOUNTER — Telehealth: Payer: Self-pay | Admitting: Cardiovascular Disease

## 2019-12-22 NOTE — Telephone Encounter (Signed)
lmov to schedule echo and fasting labs per checkout 12-20-19

## 2020-01-02 ENCOUNTER — Other Ambulatory Visit
Admission: RE | Admit: 2020-01-02 | Discharge: 2020-01-02 | Disposition: A | Discharge status: Home / Self Care | Payer: Medicare Other | Source: Ambulatory Visit | Attending: Cardiovascular Disease | Admitting: Cardiovascular Disease

## 2020-01-02 ENCOUNTER — Ambulatory Visit (INDEPENDENT_AMBULATORY_CARE_PROVIDER_SITE_OTHER): Payer: Medicare Other

## 2020-01-02 ENCOUNTER — Other Ambulatory Visit: Payer: Self-pay

## 2020-01-02 DIAGNOSIS — I5022 Chronic systolic (congestive) heart failure: Secondary | ICD-10-CM

## 2020-01-02 DIAGNOSIS — I2510 Atherosclerotic heart disease of native coronary artery without angina pectoris: Secondary | ICD-10-CM | POA: Diagnosis not present

## 2020-01-02 DIAGNOSIS — E785 Hyperlipidemia, unspecified: Secondary | ICD-10-CM | POA: Diagnosis present

## 2020-01-02 DIAGNOSIS — I255 Ischemic cardiomyopathy: Secondary | ICD-10-CM | POA: Diagnosis not present

## 2020-01-02 LAB — CBC WITH DIFFERENTIAL/PLATELET
Abs Immature Granulocytes: 0.03 10*3/uL (ref 0.00–0.07)
Basophils Absolute: 0.1 10*3/uL (ref 0.0–0.1)
Basophils Relative: 1 %
Eosinophils Absolute: 0.3 10*3/uL (ref 0.0–0.5)
Eosinophils Relative: 4 %
HCT: 45.3 % (ref 39.0–52.0)
Hemoglobin: 15.8 g/dL (ref 13.0–17.0)
Immature Granulocytes: 0 %
Lymphocytes Relative: 37 %
Lymphs Abs: 3.1 10*3/uL (ref 0.7–4.0)
MCH: 31.7 pg (ref 26.0–34.0)
MCHC: 34.9 g/dL (ref 30.0–36.0)
MCV: 91 fL (ref 80.0–100.0)
Monocytes Absolute: 0.9 10*3/uL (ref 0.1–1.0)
Monocytes Relative: 11 %
Neutro Abs: 4.1 10*3/uL (ref 1.7–7.7)
Neutrophils Relative %: 47 %
Platelets: 230 10*3/uL (ref 150–400)
RBC: 4.98 MIL/uL (ref 4.22–5.81)
RDW: 12.4 % (ref 11.5–15.5)
WBC: 8.5 10*3/uL (ref 4.0–10.5)
nRBC: 0 % (ref 0.0–0.2)

## 2020-01-02 LAB — COMPREHENSIVE METABOLIC PANEL
ALT: 26 U/L (ref 0–44)
AST: 25 U/L (ref 15–41)
Albumin: 4.3 g/dL (ref 3.5–5.0)
Alkaline Phosphatase: 53 U/L (ref 38–126)
Anion gap: 16 — ABNORMAL HIGH (ref 5–15)
BUN: 16 mg/dL (ref 8–23)
CO2: 26 mmol/L (ref 22–32)
Calcium: 9.5 mg/dL (ref 8.9–10.3)
Chloride: 101 mmol/L (ref 98–111)
Creatinine, Ser: 0.9 mg/dL (ref 0.61–1.24)
GFR calc Af Amer: >60 mL/min (ref >60)
GFR calc non Af Amer: >60 mL/min (ref >60)
Glucose, Bld: 107 mg/dL — ABNORMAL HIGH (ref 70–99)
Potassium: 4 mmol/L (ref 3.5–5.1)
Sodium: 143 mmol/L (ref 135–145)
Total Bilirubin: 1 mg/dL (ref 0.3–1.2)
Total Protein: 7 g/dL (ref 6.5–8.1)

## 2020-01-02 LAB — LIPID PANEL
Cholesterol: 122 mg/dL (ref 0–200)
HDL: 31 mg/dL — ABNORMAL LOW (ref >40)
LDL Cholesterol: 54 mg/dL (ref 0–99)
Total CHOL/HDL Ratio: 3.9 RATIO
Triglycerides: 184 mg/dL — ABNORMAL HIGH (ref <150)
VLDL: 37 mg/dL (ref 0–40)

## 2020-01-02 NOTE — Addendum Note (Signed)
Addended by: Deloria Lair on: 01/02/2020 10:07 AM   Modules accepted: Orders

## 2020-01-02 NOTE — Addendum Note (Signed)
Addended by: Ladonte Verstraete on: 01/02/2020 10:07 AM   Modules accepted: Orders  

## 2020-01-06 ENCOUNTER — Telehealth: Payer: Self-pay

## 2020-01-06 NOTE — Telephone Encounter (Signed)
Called to give the patient echo and lab results.  DPR on file lmom with results. Patient is to contact the office if any questions.

## 2020-01-06 NOTE — Telephone Encounter (Signed)
-----   Message from Iran Ouch, MD sent at 01/06/2020  8:18 AM EST ----- Inform patient that echo was fine.  Normal ejection fraction with only mild mitral regurgitation.  The EF improved to normal compared to before.

## 2020-02-09 ENCOUNTER — Other Ambulatory Visit
Admission: RE | Admit: 2020-02-09 | Discharge: 2020-02-09 | Disposition: A | Discharge status: Home / Self Care | Payer: Medicare Other | Source: Ambulatory Visit | Attending: Internal Medicine | Admitting: Internal Medicine

## 2020-02-09 DIAGNOSIS — Z20822 Contact with and (suspected) exposure to covid-19: Secondary | ICD-10-CM | POA: Insufficient documentation

## 2020-02-09 DIAGNOSIS — Z01812 Encounter for preprocedural laboratory examination: Secondary | ICD-10-CM | POA: Diagnosis present

## 2020-02-09 LAB — SARS CORONAVIRUS 2 (TAT 6-24 HRS): SARS Coronavirus 2: NEGATIVE

## 2020-02-10 ENCOUNTER — Encounter: Payer: Self-pay | Admitting: Internal Medicine

## 2020-02-13 ENCOUNTER — Ambulatory Visit
Admission: RE | Admit: 2020-02-13 | Discharge: 2020-02-13 | Disposition: A | Discharge status: Home / Self Care | Payer: Medicare Other | Attending: Internal Medicine | Admitting: Internal Medicine

## 2020-02-13 ENCOUNTER — Encounter
Admission: RE | Disposition: A | Discharge status: Home / Self Care | Payer: Self-pay | Source: Home / Self Care | Attending: Internal Medicine

## 2020-02-13 ENCOUNTER — Encounter: Payer: Self-pay | Admitting: *Deleted

## 2020-02-13 ENCOUNTER — Ambulatory Visit: Payer: Medicare Other | Admitting: Anesthesiology

## 2020-02-13 ENCOUNTER — Other Ambulatory Visit: Payer: Self-pay

## 2020-02-13 DIAGNOSIS — K635 Polyp of colon: Secondary | ICD-10-CM | POA: Diagnosis not present

## 2020-02-13 DIAGNOSIS — K640 First degree hemorrhoids: Secondary | ICD-10-CM | POA: Diagnosis not present

## 2020-02-13 DIAGNOSIS — Z1211 Encounter for screening for malignant neoplasm of colon: Secondary | ICD-10-CM | POA: Diagnosis present

## 2020-02-13 DIAGNOSIS — I480 Paroxysmal atrial fibrillation: Secondary | ICD-10-CM | POA: Diagnosis not present

## 2020-02-13 DIAGNOSIS — I2510 Atherosclerotic heart disease of native coronary artery without angina pectoris: Secondary | ICD-10-CM | POA: Diagnosis not present

## 2020-02-13 DIAGNOSIS — I255 Ischemic cardiomyopathy: Secondary | ICD-10-CM | POA: Insufficient documentation

## 2020-02-13 DIAGNOSIS — Z87891 Personal history of nicotine dependence: Secondary | ICD-10-CM | POA: Insufficient documentation

## 2020-02-13 DIAGNOSIS — K5730 Diverticulosis of large intestine without perforation or abscess without bleeding: Secondary | ICD-10-CM | POA: Diagnosis not present

## 2020-02-13 DIAGNOSIS — Z6831 Body mass index (BMI) 31.0-31.9, adult: Secondary | ICD-10-CM | POA: Insufficient documentation

## 2020-02-13 DIAGNOSIS — I252 Old myocardial infarction: Secondary | ICD-10-CM | POA: Insufficient documentation

## 2020-02-13 DIAGNOSIS — Z86010 Personal history of colonic polyps: Secondary | ICD-10-CM | POA: Insufficient documentation

## 2020-02-13 DIAGNOSIS — Z7982 Long term (current) use of aspirin: Secondary | ICD-10-CM | POA: Insufficient documentation

## 2020-02-13 DIAGNOSIS — E785 Hyperlipidemia, unspecified: Secondary | ICD-10-CM | POA: Insufficient documentation

## 2020-02-13 DIAGNOSIS — Z79899 Other long term (current) drug therapy: Secondary | ICD-10-CM | POA: Diagnosis not present

## 2020-02-13 HISTORY — PX: COLONOSCOPY WITH PROPOFOL: SHX5780

## 2020-02-13 SURGERY — COLONOSCOPY WITH PROPOFOL
Anesthesia: General

## 2020-02-13 MED ORDER — EPHEDRINE SULFATE-NACL 50-0.9 MG/10ML-% IV SOSY
PREFILLED_SYRINGE | INTRAVENOUS | Status: DC | PRN
  Administered 2020-02-13: 10 mg via INTRAVENOUS

## 2020-02-13 MED ORDER — LIDOCAINE HCL (CARDIAC) PF 100 MG/5ML IV SOSY
PREFILLED_SYRINGE | INTRAVENOUS | Status: DC | PRN
  Administered 2020-02-13: 50 mg via INTRAVENOUS

## 2020-02-13 MED ORDER — LIDOCAINE HCL (PF) 2 % IJ SOLN
INTRAMUSCULAR | Status: AC
  Filled 2020-02-13: qty 10

## 2020-02-13 MED ORDER — PROPOFOL 10 MG/ML IV BOLUS
INTRAVENOUS | Status: DC | PRN
  Administered 2020-02-13: 100 mg via INTRAVENOUS

## 2020-02-13 MED ORDER — PROPOFOL 500 MG/50ML IV EMUL
INTRAVENOUS | Status: DC | PRN
  Administered 2020-02-13: 120 ug/kg/min via INTRAVENOUS

## 2020-02-13 MED ORDER — PROPOFOL 500 MG/50ML IV EMUL
INTRAVENOUS | Status: AC
  Filled 2020-02-13: qty 50

## 2020-02-13 MED ORDER — SODIUM CHLORIDE 0.9 % IV SOLN: INTRAVENOUS | Status: DC

## 2020-02-13 NOTE — Op Note (Signed)
[facility] Gastroenterology Patient Name: Gilbert Coleman Procedure Date: 02/13/2020 1:22 PM MRN: [id number] Account #: [id number] Date of Birth: [birth year] Admit Type: Outpatient Age: 69 Room: [facility] ENDO ROOM 3 Gender: Male Note Status: Finalized Procedure:             Colonoscopy Indications:           Surveillance: Personal history of adenomatous polyps                         on last colonoscopy > 5 years ago Providers:             Royce Macadamia K. Staci Carver MD, MD Medicines:             Propofol per Anesthesia Complications:         No immediate complications. Procedure:             Pre-Anesthesia Assessment:                        - The risks and benefits of the procedure and the                         sedation options and risks were discussed with the                         patient. All questions were answered and informed                         consent was obtained.                        - Patient identification and proposed procedure were                         verified prior to the procedure by the nurse. The                         procedure was verified in the procedure room.                        - ASA Grade Assessment: III - A patient with severe                         systemic disease.                        - After reviewing the risks and benefits, the patient                         was deemed in satisfactory condition to undergo the                         procedure.                        After obtaining informed consent, the colonoscope was                         passed under direct vision. Throughout the procedure,  the patient's blood pressure, pulse, and oxygen                         saturations were monitored continuously. The                         Colonoscope was introduced through the anus and                         advanced to the the cecum, identified by appendiceal                         orifice and ileocecal  valve. The colonoscopy was                         performed without difficulty. The patient tolerated                         the procedure well. The quality of the bowel                         preparation was good. The ileocecal valve, appendiceal                         orifice, and rectum were photographed. Findings:      The perianal and digital rectal examinations were normal. Pertinent       negatives include normal sphincter tone and no palpable rectal lesions.      Non-bleeding internal hemorrhoids were found during retroflexion. The       hemorrhoids were Grade I (internal hemorrhoids that do not prolapse).      Many small-mouthed diverticula were found in the sigmoid colon.      A 4 mm polyp was found in the distal sigmoid colon. The polyp was       sessile. The polyp was removed with a cold biopsy forceps. Resection and       retrieval were complete.      The exam was otherwise without abnormality on direct and retroflexion       views. Impression:            - Non-bleeding internal hemorrhoids.                        - Diverticulosis in the sigmoid colon.                        - One 4 mm polyp in the distal sigmoid colon, removed                         with a cold biopsy forceps. Resected and retrieved.                        - The examination was otherwise normal on direct and                         retroflexion views. Recommendation:        - Patient has a contact number available for  emergencies. The signs and symptoms of potential                         delayed complications were discussed with the patient.                         Return to normal activities tomorrow. Written                         discharge instructions were provided to the patient.                        - Resume previous diet.                        - Continue present medications.                        - Repeat colonoscopy is recommended for surveillance.                          The colonoscopy date will be determined after                         pathology results from today's exam become available                         for review.                        - Return to GI office in 1 year.                        - The findings and recommendations were discussed with                         the patient. Procedure Code(s):     --- Professional ---                        [phone], Colonoscopy, flexible; with biopsy, single or                         multiple Diagnosis Code(s):     --- Professional ---                        K57.30, Diverticulosis of large intestine without                         perforation or abscess without bleeding                        K63.5, Polyp of colon                        K64.0, First degree hemorrhoids                        Z86.010, Personal history of colonic polyps CPT copyright 2019 American Medical Association. All rights reserved. The codes documented in this report are preliminary and upon coder review may  be revised to  meet current compliance requirements. Stanton Kidney MD, MD 02/13/2020 1:40:56 PM This report has been signed electronically. Number of Addenda: 0 Note Initiated On: 02/13/2020 1:22 PM Scope Withdrawal Time: 0 hours 8 minutes 9 seconds  Total Procedure Duration: 0 hours 10 minutes 16 seconds  Estimated Blood Loss:  Estimated blood loss: none.      [facility]

## 2020-02-13 NOTE — H&P (Signed)
Outpatient short stay form Pre-procedure 02/13/2020 11:03 AM Gilbert Coleman K. Gilbert Coleman, M.D.  Primary Physician: Gilbert Coleman, M.D.  Reason for visit: Personal hx of tubular adenoma x 1, 2015 colonoscopy.  History of present illness:                            Patient presents for colonoscopy for a personal hx of colon polyps. The patient denies abdominal pain, abnormal weight loss or rectal bleeding.     No current facility-administered medications for this encounter.  Current Outpatient Medications:  .  aspirin 81 MG tablet, Take 1 tablet by mouth daily., Disp: , Rfl:  .  lisinopril (ZESTRIL) 5 MG tablet, TAKE ONE TABLET EVERY DAY, Disp: 90 tablet, Rfl: 3 .  metoprolol tartrate (LOPRESSOR) 25 MG tablet, TAKE ONE TABLET TWICE DAILY, Disp: 180 tablet, Rfl: 3 .  Misc Natural Products (GLUCOSAMINE CHONDROITIN COMPLX) TABS, Take 1 tablet by mouth daily., Disp: , Rfl:  .  Multiple Vitamin (MULTIVITAMIN WITH MINERALS) TABS tablet, Take 1 tablet by mouth daily., Disp: , Rfl:  .  nitroGLYCERIN (NITROSTAT) 0.4 MG SL tablet, Place 1 tablet (0.4 mg total) under the tongue every 5 (five) minutes as needed for chest pain., Disp: 25 tablet, Rfl: 3 .  Omega-3 Fatty Acids (FISH OIL) 1000 MG CAPS, Take 2 capsules (2,000 mg total) by mouth daily., Disp: 60 capsule, Rfl: 0 .  rosuvastatin (CRESTOR) 20 MG tablet, TAKE ONE TABLET EVERY DAY, Disp: 90 tablet, Rfl: 3  No medications prior to admission.     Allergies  Allergen Reactions  . Atorvastatin     Other reaction(s): Muscle Pain     Past Medical History:  Diagnosis Date  . CAD (coronary artery disease)    a. LM nl, LAD 87m, 20d, D1/D2 min irregs, LCX 39m (3.0x15 Xience Alpine DES), OM1/2 nl, OM3 30ost, RCA 70p/m, RPDA /RPL1/RPL2 nl, EF 45-50%-->admisison complicated by recurrent VF requiring Defib and amio.  Marland Kitchen History of chicken pox   . History of measles   . Hyperlipidemia   . Ischemic cardiomyopathy    a. 10/2015 Echo: EF 40-45%, sev inflat HK,  mild MR, mildly dil LA, PASP .  . Morbid obesity (HCC)   . PAF (paroxysmal atrial fibrillation) (HCC)    a. At time of STEMI in setting of recurrent VF/defib/ACLS meds-->Amio.    Review of systems:  Otherwise negative.    Physical Exam  Gen: Alert, oriented. Appears stated age.  HEENT: [place]/AT. PERRLA. Lungs: CTA, no wheezes. CV: RR nl S1, S2. Abd: soft, benign, no masses. BS+ Ext: No edema. Pulses 2+    Planned procedures: Proceed with colonoscopy. The patient understands the nature of the planned procedure, indications, risks, alternatives and potential complications including but not limited to bleeding, infection, perforation, damage to internal organs and possible oversedation/side effects from anesthesia. The patient agrees and gives consent to proceed.  Please refer to procedure notes for findings, recommendations and patient disposition/instructions.     Gilbert Coleman K. Gilbert Coleman, M.D. Gastroenterology 02/13/2020  11:03 AM

## 2020-02-13 NOTE — Anesthesia Preprocedure Evaluation (Signed)
Anesthesia Evaluation  Patient identified by MRN, date of birth, ID band Patient awake    Reviewed: Allergy & Precautions, NPO status , Patient's Chart, lab work & pertinent test results  History of Anesthesia Complications Negative for: history of anesthetic complications  Airway Mallampati: II  TM Distance: >3 FB Neck ROM: Full    Dental no notable dental hx. (+) Teeth Intact   Pulmonary neg pulmonary ROS, neg sleep apnea, neg COPD, Patient abstained from smoking.Not current smoker, former smoker,    Pulmonary exam normal breath sounds clear to auscultation       Cardiovascular Exercise Tolerance: Good METS(-) hypertension+ CAD, + Past MI and + Cardiac Stents  (-) dysrhythmias  Rhythm:Regular Rate:Normal - Systolic murmurs TTE 2671: 1. Left ventricular ejection fraction, by estimation, is 55 to 60%. The  left ventricle has normal function. The left ventricle has no regional  wall motion abnormalities. Left ventricular diastolic parameters were  normal.  2. Right ventricular systolic function is normal. The right ventricular  size is normal. Tricuspid regurgitation signal is inadequate for assessing  PA pressure.  3. Left atrial size was mildly dilated.  4. Right atrial size was mildly dilated.  5. The mitral valve is normal in structure and function. Mild mitral  valve regurgitation. No evidence of mitral stenosis.  6. The aortic valve is normal in structure and function. Aortic valve  regurgitation is not visualized. Mild to moderate aortic valve  sclerosis/calcification is present, without any evidence of aortic  stenosis.  7. The inferior vena cava is normal in size with greater than 50%  respiratory variability, suggesting right atrial pressure of 3 mmHg.   Comparison(s): EF 40-45%, A-fib history.   Cath 2016 for MI:  Prox RCA to Mid RCA lesion, 70% stenosed.  Mid LAD lesion, 30% stenosed.  Dist LAD  lesion, 20% stenosed.  Ost 3rd Mrg to 3rd Mrg lesion, 30% stenosed.  Mid Cx lesion, 99% stenosed. Post intervention, there is a 0% residual stenosis.  There is mild left ventricular systolic dysfunction.   1. Significant 2 vessel coronary artery disease. The culprit for myocardial infarction is subtotal thrombotic occlusion of the mid left circumflex.  2. Mildly reduced LV systolic function with an ejection fraction of 45%. Mildly elevated left ventricular end-diastolic pressure.  3. Successful angioplasty and drug-eluting stent placement to the mid left circumflex.   Neuro/Psych negative neurological ROS  negative psych ROS   GI/Hepatic neg GERD  ,(+)     (-) substance abuse  ,   Endo/Other  neg diabetes  Renal/GU negative Renal ROS     Musculoskeletal   Abdominal   Peds  Hematology   Anesthesia Other Findings Past Medical History: No date: CAD (coronary artery disease)     Comment:  a. LM nl, LAD 12m, 20d, D1/D2 min irregs, LCX 14m               (3.0x15 Xience Alpine DES), OM1/2 nl, OM3 30ost, RCA               70p/m, RPDA /RPL1/RPL2 nl, EF 45-50%-->admisison               complicated by recurrent VF requiring Defib and amio. No date: History of chicken pox No date: History of measles No date: Hyperlipidemia No date: Ischemic cardiomyopathy     Comment:  a. 10/2015 Echo: EF 40-45%, sev inflat HK, mild MR,               mildly dil  LA, PASP . No date: Morbid obesity (HCC) No date: PAF (paroxysmal atrial fibrillation) (HCC)     Comment:  a. At time of STEMI in setting of recurrent               VF/defib/ACLS meds-->Amio.  Reproductive/Obstetrics                            Anesthesia Physical Anesthesia Plan  ASA: III  Anesthesia Plan: General   Post-op Pain Management:    Induction: Intravenous  PONV Risk Score and Plan: 2 and Ondansetron, Propofol infusion and TIVA  Airway Management Planned: Nasal Cannula  Additional  Equipment: None  Intra-op Plan:   Post-operative Plan:   Informed Consent: I have reviewed the patients History and Physical, chart, labs and discussed the procedure including the risks, benefits and alternatives for the proposed anesthesia with the patient or authorized representative who has indicated his/her understanding and acceptance.     Dental advisory given  Plan Discussed with: CRNA and Surgeon  Anesthesia Plan Comments: (Discussed risks of anesthesia with patient, including possibility of difficulty with spontaneous ventilation under anesthesia necessitating airway intervention, PONV, and rare risks such as cardiac or respiratory or neurological events. Patient understands. Patient last took baby ASA prior Wednesday. Patient understands theoretical increased risk for cardiac events due to this.)        Anesthesia Quick Evaluation

## 2020-02-13 NOTE — Anesthesia Postprocedure Evaluation (Signed)
Anesthesia Post Note  Patient: Gilbert Coleman  Procedure(s) Performed: COLONOSCOPY WITH PROPOFOL (N/A )  Patient location during evaluation: Endoscopy Anesthesia Type: General Level of consciousness: awake and alert Pain management: pain level controlled Vital Signs Assessment: post-procedure vital signs reviewed and stable Respiratory status: spontaneous breathing, nonlabored ventilation, respiratory function stable and patient connected to nasal cannula oxygen Cardiovascular status: blood pressure returned to baseline and stable Postop Assessment: no apparent nausea or vomiting Anesthetic complications: no     Last Vitals:  Vitals:   02/13/20 1343 02/13/20 1403  BP: (!) 88/56 (!) 102/51  Pulse:    Resp: 20   Temp: (!) 35.9 C   SpO2: 97%     Last Pain:  Vitals:   02/13/20 1403  TempSrc:   PainSc: 0-No pain                 Corinda Gubler

## 2020-02-13 NOTE — Interval H&P Note (Signed)
History and Physical Interval Note:  02/13/2020 12:48 PM  Gilbert Coleman  has presented today for surgery, with the diagnosis of P HX COLON POLYPS.  The various methods of treatment have been discussed with the patient and family. After consideration of risks, benefits and other options for treatment, the patient has consented to  Procedure(s): COLONOSCOPY WITH PROPOFOL (N/A) as a surgical intervention.  The patient's history has been reviewed, patient examined, no change in status, stable for surgery.  I have reviewed the patient's chart and labs.  Questions were answered to the patient's satisfaction.     [place], [place]

## 2020-02-13 NOTE — Transfer of Care (Signed)
Immediate Anesthesia Transfer of Care Note  Patient: Gilbert Coleman  Procedure(s) Performed: COLONOSCOPY WITH PROPOFOL (N/A )  Patient Location: Endoscopy Unit  Anesthesia Type:General  Level of Consciousness: drowsy and patient cooperative  Airway & Oxygen Therapy: Patient Spontanous Breathing  Post-op Assessment: Report given to RN and Post -op Vital signs reviewed and stable  Post vital signs: Reviewed and stable  Last Vitals:  Vitals Value Taken Time  BP 88/56 02/13/20 1344  Temp 35.9 C 02/13/20 1343  Pulse 69 02/13/20 1345  Resp 22 02/13/20 1345  SpO2 96 % 02/13/20 1345  Vitals shown include unvalidated device data.  Last Pain:  Vitals:   02/13/20 1343  TempSrc: Temporal  PainSc: Asleep         Complications: No apparent anesthesia complications

## 2020-02-14 LAB — SURGICAL PATHOLOGY

## 2020-07-08 ENCOUNTER — Other Ambulatory Visit: Payer: Self-pay | Admitting: Cardiovascular Disease

## 2020-12-05 ENCOUNTER — Ambulatory Visit (INDEPENDENT_AMBULATORY_CARE_PROVIDER_SITE_OTHER): Payer: Medicare Other | Admitting: Physician Assistant

## 2020-12-05 ENCOUNTER — Other Ambulatory Visit: Payer: Self-pay

## 2020-12-05 ENCOUNTER — Encounter: Payer: Self-pay | Admitting: Physician Assistant

## 2020-12-05 VITALS — BP 129/71 | HR 74 | Temp 98.6°F | Wt 234.8 lb

## 2020-12-05 DIAGNOSIS — R109 Unspecified abdominal pain: Secondary | ICD-10-CM | POA: Diagnosis not present

## 2020-12-05 DIAGNOSIS — R350 Frequency of micturition: Secondary | ICD-10-CM

## 2020-12-05 DIAGNOSIS — N410 Acute prostatitis: Secondary | ICD-10-CM

## 2020-12-05 LAB — POCT URINALYSIS DIPSTICK
Bilirubin, UA: NEGATIVE
Glucose, UA: NEGATIVE
Ketones, UA: NEGATIVE
Nitrite, UA: NEGATIVE
Protein, UA: POSITIVE — AB
Spec Grav, UA: 1.025 (ref 1.010–1.025)
Urobilinogen, UA: 0.2 E.U./dL
pH, UA: 6 (ref 5.0–8.0)

## 2020-12-05 MED ORDER — CIPROFLOXACIN HCL 500 MG PO TABS: 500.0000 mg | ORAL_TABLET | Freq: Two times a day (BID) | ORAL | 0 refills | Status: AC

## 2020-12-05 NOTE — Patient Instructions (Signed)
Prostatitis  Prostatitis is swelling of the prostate gland, also called the prostate. This gland is about 1.5 inches wide and 1 inch high, and it helps to make a fluid called semen. The prostate is below a man's bladder, in front of the butt (rectum). There are different types of prostatitis. What are the causes? One type of prostatitis is caused by an infection from germs (bacteria). Another type is not caused by germs. It may be caused by:  Things having to do with the nervous system. This system includes thebrain, spinal cord, and nerves.  An autoimmune response. This happens when the body's disease-fighting system attacks healthy tissue in the body by mistake.  Psychological factors. These have to do with how the mind works. The causes of other types of prostatitis are normally not known. What are the signs or symptoms? Symptoms of this condition depend on the type of prostatitis you have. If your condition is caused by germs:  You may feel pain or burning when you pee (urinate).  You may pee often and all of a sudden.  You may have problems starting to pee.  You may have trouble emptying your bladder when you pee.  You may have fever or chills.  You may feel pain in your muscles, joints, low back, or lower belly. If you have other types of prostatitis:  You may pee often or all of a sudden.  You may have trouble starting to pee.  You may have a weak flow when you pee.  You may leak pee after using the bathroom.  You may have other problems, such as: ? Abnormal fluid coming from the penis. ? Pain in the testicles or penis. ? Pain between the butt and the testicles. ? Pain when fluid comes out of the penis during sex. How is this treated? Treatment for this condition depends on the type of prostatitis. Treatment may include:  Medicines. These may treat pain or swelling, or they may help relax muscles.  Exercises to help you move better or get stronger (physical  therapy).  Heat therapy.  Techniques to help you control some of the ways that your body works.  Exercises to help you relax.  Antibiotic medicine, if your condition is caused by germs.  Warm water baths (sitz baths) to relax muscles. Follow these instructions at home: Medicines  Take over-the-counter and prescription medicines only as told by your doctor.  If you were prescribed an antibiotic medicine, take it as told by your doctor. Do not stop using the antibiotic even if you start to feel better. Managing pain and swelling  Take sitz baths as told by your doctor. For a sitz bath, sit in warm water that is deep enough to cover your hips and butt.  If told, put heat on the painful area. Do this as often as told by your doctor. Use the heat source that your doctor recommends, such as a moist heat pack or a heating pad. ? Place a towel between your skin and the heat source. ? Leave the heat on for 20-30 minutes. ? Take off the heat if your skin turns bright red. This is very important if you are unable to feel pain, heat, or cold. You may have a greater risk of getting burned.   General instructions  Do exercises as told by your doctor, if your doctor prescribed them.  Keep all follow-up visits as told by your doctor. This is important. Where to find more information  National Institute   of Diabetes and Digestive and Kidney Diseases: https://www.niddk.nih.gov Contact a doctor if:  Your symptoms get worse.  You have a fever. Get help right away if:  You have chills.  You feel light-headed.  You feel like you may faint.  You cannot pee.  You have blood or clumps of blood (blood clots) in your pee. Summary  Prostatitis is swelling of the prostate gland.  There are different types of prostatitis. Treatment depends on the type that you have.  Take over-the-counter and prescription medicines only as told by your doctor.  Get help right away of you have chills, feel  light-headed, or feel like you may faint. Also get help right away if you cannot pee or you have blood or clumps of blood in your pee. This information is not intended to replace advice given to you by your health care provider. Make sure you discuss any questions you have with your health care provider. Document Revised: 11/25/2019 Document Reviewed: 11/25/2019 Elsevier Patient Education  2021 Elsevier Inc.  

## 2020-12-05 NOTE — Progress Notes (Signed)
Established patient visit   Patient: Gilbert Coleman   DOB: 1951-08-24   70 y.o. Male  MRN: [id number] Visit Date: 12/05/2020  Today's healthcare provider: Trey Sailors, PA-C   Chief Complaint  Patient presents with  . Abdominal Pain  . Back Pain  . Urinary Frequency  I,Porsha C McClurkin,acting as a scribe for Trey Sailors, PA-C.,have documented all relevant documentation on the behalf of Trey Sailors, PA-C,as directed by  Trey Sailors, PA-C while in the presence of Trey Sailors, PA-C.  Subjective    Urinary Frequency  This is a new problem. The current episode started yesterday. The problem occurs every urination. The problem has been unchanged. The quality of the pain is described as aching, burning and stabbing. The pain is at a severity of 5/10. The pain is moderate. There has been no fever. Associated symptoms include frequency and urgency. Pertinent negatives include no discharge, hesitancy, nausea or vomiting. He has tried nothing for the symptoms. The treatment provided no relief.  Abdominal Pain This is a new problem. The current episode started yesterday. The problem occurs constantly. The problem has been unchanged. The pain is located in the LUQ and RUQ. The pain is at a severity of 5/10. The pain is moderate. The quality of the pain is aching, burning and cramping. The abdominal pain radiates to the back. Associated symptoms include dysuria and frequency. Pertinent negatives include no belching, constipation, diarrhea, nausea or vomiting. The pain is aggravated by urination. He has tried nothing for the symptoms. The treatment provided no relief.    Patient is reporting some deep pelvic pain and rectal pain. Denies history of kidney stones. Denies diarrhea, is actually having some constipation now.      Medications: Outpatient Medications Prior to Visit  Medication Sig  . aspirin 81 MG tablet Take 1 tablet by mouth daily.  Marland Kitchen lisinopril (ZESTRIL) 5  MG tablet TAKE 1 TABLET BY MOUTH EVERY DAY  . metoprolol tartrate (LOPRESSOR) 25 MG tablet TAKE 1 TABLET BY MOUTH TWICE DAILY  . Misc Natural Products (GLUCOSAMINE CHONDROITIN COMPLX) TABS Take 1 tablet by mouth daily.  . Multiple Vitamin (MULTIVITAMIN WITH MINERALS) TABS tablet Take 1 tablet by mouth daily.  . nitroGLYCERIN (NITROSTAT) 0.4 MG SL tablet Place 1 tablet (0.4 mg total) under the tongue every 5 (five) minutes as needed for chest pain.  . Omega-3 Fatty Acids (FISH OIL) 1000 MG CAPS Take 2 capsules (2,000 mg total) by mouth daily.  . rosuvastatin (CRESTOR) 20 MG tablet TAKE 1 TABLET BY MOUTH EVERY DAY   No facility-administered medications prior to visit.    Review of Systems  Gastrointestinal: Positive for abdominal pain. Negative for constipation, diarrhea, nausea and vomiting.  Genitourinary: Positive for dysuria, frequency and urgency. Negative for hesitancy.       Objective    BP 129/71 (BP Location: Left Arm, Patient Position: Sitting, Cuff Size: Large)   Pulse 74   Temp 98.6 F (37 C) (Oral)   Wt 234 lb 12.8 oz (106.5 kg)   SpO2 98%   BMI 30.98 kg/m     Physical Exam Constitutional:      Appearance: Normal appearance. He is obese.  Cardiovascular:     Rate and Rhythm: Normal rate and regular rhythm.     Heart sounds: Normal heart sounds.  Pulmonary:     Effort: Pulmonary effort is normal.     Breath sounds: Normal breath sounds.  Abdominal:  General: Abdomen is flat. Bowel sounds are normal. There is no distension.     Palpations: Abdomen is soft.     Tenderness: There is no abdominal tenderness.  Skin:    General: Skin is warm and dry.  Neurological:     General: No focal deficit present.     Mental Status: He is alert and oriented to person, place, and time. Mental status is at baseline.  Psychiatric:        Mood and Affect: Mood normal.        Behavior: Behavior normal.       Results for orders placed or performed in visit on 12/05/20   POCT urinalysis dipstick  Result Value Ref Range   Color, UA Yellow    Clarity, UA Clear    Glucose, UA Negative Negative   Bilirubin, UA Negative    Ketones, UA Negative    Spec Grav, UA 1.025 1.010 - 1.025   Blood, UA Hemolyzed:moderate    pH, UA 6.0 5.0 - 8.0   Protein, UA Positive (A) Negative   Urobilinogen, UA 0.2 0.2 or 1.0 E.U./dL   Nitrite, UA Negative    Leukocytes, UA Trace (A) Negative   Appearance     Odor      Assessment & Plan    1. Abdominal pain, unspecified abdominal location  UTI vs. Prostatitis, will treat as below and send for culture. Also consider kidney stone. Return precautions counseled.   - POCT urinalysis dipstick - Urine Culture - Urine Microscopic - ciprofloxacin (CIPRO) 500 MG tablet; Take 1 tablet (500 mg total) by mouth 2 (two) times daily for 10 days.  Dispense: 20 tablet; Refill: 0  2. Urinary frequency  - POCT urinalysis dipstick - Urine Culture - Urine Microscopic - ciprofloxacin (CIPRO) 500 MG tablet; Take 1 tablet (500 mg total) by mouth 2 (two) times daily for 10 days.  Dispense: 20 tablet; Refill: 0  3. Acute prostatitis  - ciprofloxacin (CIPRO) 500 MG tablet; Take 1 tablet (500 mg total) by mouth 2 (two) times daily for 10 days.  Dispense: 20 tablet; Refill: 0   No follow-ups on file.      ITrey Sailors, PA-C, have reviewed all documentation for this visit. The documentation on 12/06/20 for the exam, diagnosis, procedures, and orders are all accurate and complete.  The entirety of the information documented in the History of Present Illness, Review of Systems and Physical Exam were personally obtained by me. Portions of this information were initially documented by [facility] and reviewed by me for thoroughness and accuracy.     Maryella Shivers  [facility] [phone] (phone) [phone] (fax)  [facility]

## 2020-12-08 LAB — URINALYSIS, MICROSCOPIC ONLY
Bacteria, UA: NONE SEEN
Casts: NONE SEEN /lpf
WBC, UA: NONE SEEN /hpf (ref 0–5)

## 2020-12-08 LAB — URINE CULTURE: Organism ID, Bacteria: NO GROWTH

## 2021-01-01 NOTE — Progress Notes (Signed)
Subjective:   Gilbert CrawfordRobert D Coleman is a 70 y.o. male who presents for an Initial Medicare Annual Wellness Visit.  I connected with Gilbert Charonobert Coleman today by telephone and verified that I am speaking with the correct person using two identifiers. Location patient: home Location provider: work Persons participating in the virtual visit: patient, provider.   I discussed the limitations, risks, security and privacy concerns of performing an evaluation and management service by telephone and the availability of in person appointments. I also discussed with the patient that there may be a patient responsible charge related to this service. The patient expressed understanding and verbally consented to this telephonic visit.    Interactive audio and video telecommunications were attempted between this provider and patient, however failed, due to patient having technical difficulties OR patient did not have access to video capability.  We continued and completed visit with audio only.   Review of Systems    N/A  Cardiac Risk Factors include: advanced age (>955men, 42>65 women);dyslipidemia;male gender;hypertension;obesity (BMI >30kg/m2)     Objective:    There were no vitals filed for this visit. There is no height or weight on file to calculate BMI.  Advanced Directives 01/02/2021 02/13/2020 10/26/2015  Does Patient Have a Medical Advance Directive? No No No  Would patient like information on creating a medical advance directive? No - Patient declined No - Patient declined No - patient declined information    Current Medications (verified) Outpatient Encounter Medications as of 01/02/2021  Medication Sig  . aspirin 81 MG tablet Take 1 tablet by mouth daily.  . cholecalciferol (VITAMIN D) 25 MCG (1000 UNIT) tablet Take 2,000 Units by mouth daily.  Marland Kitchen. docusate sodium (EQUATE STOOL SOFTENER) 100 MG capsule Take 100 mg by mouth 2 (two) times daily.  Marland Kitchen. latanoprost (XALATAN) 0.005 % ophthalmic solution Place 1  drop into both eyes at bedtime.  Marland Kitchen. lisinopril (ZESTRIL) 5 MG tablet TAKE 1 TABLET BY MOUTH EVERY DAY  . metoprolol tartrate (LOPRESSOR) 25 MG tablet TAKE 1 TABLET BY MOUTH TWICE DAILY  . Misc Natural Products (GLUCOSAMINE CHONDROITIN COMPLX) TABS Take 1 tablet by mouth daily.  . Multiple Vitamin (MULTIVITAMIN WITH MINERALS) TABS tablet Take 1 tablet by mouth daily.  . nitroGLYCERIN (NITROSTAT) 0.4 MG SL tablet Place 1 tablet (0.4 mg total) under the tongue every 5 (five) minutes as needed for chest pain.  . Omega-3 Fatty Acids (FISH OIL) 1000 MG CAPS Take 2 capsules (2,000 mg total) by mouth daily.  . psyllium (METAMUCIL) 58.6 % powder Take 1 packet by mouth daily.  . rosuvastatin (CRESTOR) 20 MG tablet TAKE 1 TABLET BY MOUTH EVERY DAY  . vitamin B-12 (CYANOCOBALAMIN) 1000 MCG tablet Take 1,000 mcg by mouth daily.   No facility-administered encounter medications on file as of 01/02/2021.    Allergies (verified) Atorvastatin and Ciprofloxacin   History: Past Medical History:  Diagnosis Date  . CAD (coronary artery disease)    a. LM nl, LAD 6765m, 20d, D1/D2 min irregs, LCX 8068m (3.0x15 Xience Alpine DES), OM1/2 nl, OM3 30ost, RCA 70p/m, RPDA /RPL1/RPL2 nl, EF 45-50%-->admisison complicated by recurrent VF requiring Defib and amio.  Marland Kitchen. History of chicken pox   . History of measles   . Hyperlipidemia   . Hypertension   . Ischemic cardiomyopathy    a. 10/2015 Echo: EF 40-45%, sev inflat HK, mild MR, mildly dil LA, PASP 42mmHg.  . Morbid obesity (HCC)   . PAF (paroxysmal atrial fibrillation) (HCC)    a. At  time of STEMI in setting of recurrent VF/defib/ACLS meds-->Amio.   Past Surgical History:  Procedure Laterality Date  . CARDIAC CATHETERIZATION N/A 10/26/2015   Procedure: Left Heart Cath and Coronary Angiography;  Surgeon: Iran Ouch, MD;  Location: [facility] INVASIVE CV LAB;  Service: Cardiovascular;  Laterality: N/A;  . CARDIAC CATHETERIZATION N/A 10/26/2015   Procedure: Coronary  Stent Intervention;  Surgeon: Iran Ouch, MD;  Location: [facility] INVASIVE CV LAB;  Service: Cardiovascular;  Laterality: N/A;  . CATARACT EXTRACTION Bilateral   . colon polyps    . COLONOSCOPY  06/21/2014   Diminutive polyps. Tubular ademona, Hyperplastic polyps. Repeat 06/2019  . COLONOSCOPY WITH PROPOFOL N/A 02/13/2020   Procedure: COLONOSCOPY WITH PROPOFOL;  Surgeon: Toledo, Boykin Nearing, MD;  Location: [facility] ENDOSCOPY;  Service: Gastroenterology;  Laterality: N/A;  . CORONARY ANGIOPLASTY     stent/2016  . FINGER SURGERY     Family History  Problem Relation Age of Onset  . Heart disease Father   . Heart disease Brother   . Heart disease Brother   . Alzheimer's disease Mother    Social History   Socioeconomic History  . Marital status: Married    Spouse name: Not on file  . Number of children: 1  . Years of education: Not on file  . Highest education level: Associate degree: occupational, Scientist, product/process development, or vocational program  Occupational History  . Occupation: retired  Tobacco Use  . Smoking status: Former Smoker    Packs/day: 0.25    Years: 30.00    Pack years: 7.50    Types: Cigars    Quit date: 11/03/2009    Years since quitting: 11.1  . Smokeless tobacco: Never Used  . Tobacco comment: chews nicorette  Vaping Use  . Vaping Use: Never used  Substance and Sexual Activity  . Alcohol use: Yes    Alcohol/week: 0.0 standard drinks    Comment: occasional use  . Drug use: No  . Sexual activity: Not on file  Other Topics Concern  . Not on file  Social History Narrative  . Not on file   Social Determinants of Health   Financial Resource Strain: Low Risk   . Difficulty of Paying Living Expenses: Not hard at all  Food Insecurity: No Food Insecurity  . Worried About Programme researcher, broadcasting/film/video in the Last Year: Never true  . Ran Out of Food in the Last Year: Never true  Transportation Needs: No Transportation Needs  . Lack of Transportation (Medical): No  . Lack of  Transportation (Non-Medical): No  Physical Activity: Insufficiently Active  . Days of Exercise per Week: 3 days  . Minutes of Exercise per Session: 30 min  Stress: Unknown  . Feeling of Stress : Patient refused  Social Connections: Moderately Integrated  . Frequency of Communication with Friends and Family: Twice a week  . Frequency of Social Gatherings with Friends and Family: More than three times a week  . Attends Religious Services: Never  . Active Member of Clubs or Organizations: Yes  . Attends Banker Meetings: More than 4 times per year  . Marital Status: Married    Tobacco Counseling Counseling given: Not Answered Comment: chews nicorette   Clinical Intake:  Pre-visit preparation completed: Yes  Pain : No/denies pain     Nutritional Risks: None Diabetes: No  How often do you need to have someone help you when you read instructions, pamphlets, or other written materials from your doctor or pharmacy?: 1 - Never  Diabetic? No  Interpreter Needed?: No  Information entered by :: [facility], LPN   Activities of Daily Living In your present state of health, do you have any difficulty performing the following activities: 01/02/2021 12/05/2020  Hearing? Y Y  Comment Due to ringing in both ears. -  Vision? N N  Difficulty concentrating or making decisions? N N  Walking or climbing stairs? N N  Dressing or bathing? N N  Doing errands, shopping? N N  Preparing Food and eating ? N -  Using the Toilet? N -  In the past six months, have you accidently leaked urine? N -  Do you have problems with loss of bowel control? N -  Managing your Medications? N -  Managing your Finances? N -  Housekeeping or managing your Housekeeping? N -  Some recent data might be hidden    Patient Care Team: Malva Limes, MD as PCP - General (Family Medicine) Iran Ouch, MD as PCP - Cardiology (Cardiology) Stegmayer, Marvis Moeller as Physician Assistant (Vascular  Surgery) Stanton Kidney, MD as Consulting Physician (Gastroenterology) System, Provider Not In  Indicate any recent Medical Services you may have received from other than [facility] providers in the past year (date may be approximate).     Assessment:   This is a routine wellness examination for Gilbert Coleman.  Hearing/Vision screen No exam data present  Dietary issues and exercise activities discussed: Current Exercise Habits: Home exercise routine, Type of exercise: Other - see comments;strength training/weights (rides a stationary bike), Time (Minutes): 30, Frequency (Times/Week): 3, Weekly Exercise (Minutes/Week): 90, Intensity: Mild, Exercise limited by: None identified  Goals    . DIET - INCREASE WATER INTAKE     Recommend to drink at least 6-8 8oz glasses of water per day.      Depression Screen PHQ 2/9 Scores 12/05/2020  PHQ - 2 Score 0  PHQ- 9 Score 0    Fall Risk Fall Risk  01/02/2021 12/05/2020  Falls in the past year? 0 0  Number falls in past yr: 0 0  Injury with Fall? 0 0  Risk for fall due to : - No Fall Risks  Follow up - Falls evaluation completed    FALL RISK PREVENTION PERTAINING TO THE HOME:  Any stairs in or around the home? Yes  If so, are there any without handrails? No  Home free of loose throw rugs in walkways, pet beds, electrical cords, etc? Yes  Adequate lighting in your home to reduce risk of falls? Yes   ASSISTIVE DEVICES UTILIZED TO PREVENT FALLS:  Life alert? No  Use of a cane, walker or w/c? No  Grab bars in the bathroom? No  Shower chair or bench in shower? No  Elevated toilet seat or a handicapped toilet? No    Cognitive Function: Normal cognitive status assessed by observation by this Nurse Health Advisor. No abnormalities found.          Immunizations Immunization History  Administered Date(s) Administered  . Influenza, High Dose Seasonal PF 08/03/2020  . PFIZER(Purple Top)SARS-COV-2 Vaccination 03/09/2020, 03/30/2020  . Tdap  04/24/2011  . Zoster 09/20/2014    TDAP status: Up to date  Flu Vaccine status: Up to date  Pneumococcal vaccine status: Due, Education has been provided regarding the importance of this vaccine. Advised may receive this vaccine at local pharmacy or Health Dept. Aware to provide a copy of the vaccination record if obtained from local pharmacy or Health Dept. Verbalized acceptance and understanding.  Covid-19 vaccine status: Completed vaccines  Qualifies for Shingles Vaccine? Yes   Zostavax completed Yes   Shingrix Completed?: No.    Education has been provided regarding the importance of this vaccine. Patient has been advised to call insurance company to determine out of pocket expense if they have not yet received this vaccine. Advised may also receive vaccine at local pharmacy or Health Dept. Verbalized acceptance and understanding.  Screening Tests Health Maintenance  Topic Date Due  . Hepatitis C Screening  Never done  . PNA vac Low Risk Adult (1 of 2 - PCV13) Never done  . COVID-19 Vaccine (3 - Booster for Pfizer series) 09/30/2020  . TETANUS/TDAP  04/23/2021  . COLONOSCOPY (Pts 45-47yrs Insurance coverage will need to be confirmed)  02/12/2025  . INFLUENZA VACCINE  Completed  . HPV VACCINES  Aged Out    Health Maintenance  Health Maintenance Due  Topic Date Due  . Hepatitis C Screening  Never done  . PNA vac Low Risk Adult (1 of 2 - PCV13) Never done  . COVID-19 Vaccine (3 - Booster for Pfizer series) 09/30/2020    Colorectal cancer screening: Type of screening: Colonoscopy. Completed 02/13/20. Repeat every 5 years  Lung Cancer Screening: (Low Dose CT Chest recommended if Age 31-80 years, 30 pack-year currently smoking OR have quit w/in 15years.) does qualify however declines order at this time.   Additional Screening:  Hepatitis C Screening: does qualify  Vision Screening: Recommended annual ophthalmology exams for early detection of glaucoma and other disorders of  the eye. Is the patient up to date with their annual eye exam?  Yes  Who is the provider or what is the name of the office in which the patient attends annual eye exams? [facility]. If pt is not established with a provider, would they like to be referred to a provider to establish care? No .   Dental Screening: Recommended annual dental exams for proper oral hygiene  Community Resource Referral / Chronic Care Management: CRR required this visit?  No   CCM required this visit?  No      Plan:     I have personally reviewed and noted the following in the patient's chart:   . Medical and social history . Use of alcohol, tobacco or illicit drugs  . Current medications and supplements . Functional ability and status . Nutritional status . Physical activity . Advanced directives . List of other physicians . Hospitalizations, surgeries, and ER visits in previous 12 months . Vitals . Screenings to include cognitive, depression, and falls . Referrals and appointments  In addition, I have reviewed and discussed with patient certain preventive protocols, quality metrics, and best practice recommendations. A written personalized care plan for preventive services as well as general preventive health recommendations were provided to patient.     Robbin Loughmiller [place], [place]   06/03/8562   Nurse Notes: Pt needs a Prevnar 13 vaccine and Hep C lab at next in office apt. Pt declined a Covid booster at this time.

## 2021-01-02 ENCOUNTER — Ambulatory Visit (INDEPENDENT_AMBULATORY_CARE_PROVIDER_SITE_OTHER): Payer: Medicare Other

## 2021-01-02 ENCOUNTER — Other Ambulatory Visit: Payer: Self-pay

## 2021-01-02 DIAGNOSIS — Z0000 Encounter for general adult medical examination without abnormal findings: Secondary | ICD-10-CM | POA: Diagnosis not present

## 2021-01-02 NOTE — Patient Instructions (Signed)
Gilbert Coleman , Thank you for taking time to come for your Medicare Wellness Visit. I appreciate your ongoing commitment to your health goals. Please review the following plan we discussed and let me know if I can assist you in the future.   Screening recommendations/referrals: Colonoscopy: Up to date, due 02/2025 Recommended yearly ophthalmology/optometry visit for glaucoma screening and checkup Recommended yearly dental visit for hygiene and checkup  Vaccinations: Influenza vaccine: Done 08/2020 Pneumococcal vaccine: Currently due, Prevnar 13 due.  Tdap vaccine: Up to date, 04/2021 Shingles vaccine: Shingrix discussed. Please contact your pharmacy for coverage information.     Advanced directives: Advance directive discussed with you today. Even though you declined this today please call our office should you change your mind and we can give you the proper paperwork for you to fill out.  Conditions/risks identified: Recommend to drink at least 6-8 8oz glasses of water per day.  Next appointment: 02/25/21 @ 2:00 PM with Dr Sherrie Mustache. Declined scheduling an AWV for 2023 at this time.   Preventive Care 21 Years and Older, Male Preventive care refers to lifestyle choices and visits with your health care provider that can promote health and wellness. What does preventive care include?  A yearly physical exam. This is also called an annual well check.  Dental exams once or twice a year.  Routine eye exams. Ask your health care provider how often you should have your eyes checked.  Personal lifestyle choices, including:  Daily care of your teeth and gums.  Regular physical activity.  Eating a healthy diet.  Avoiding tobacco and drug use.  Limiting alcohol use.  Practicing safe sex.  Taking low doses of aspirin every day.  Taking vitamin and mineral supplements as recommended by your health care provider. What happens during an annual well check? The services and screenings done by  your health care provider during your annual well check will depend on your age, overall health, lifestyle risk factors, and family history of disease. Counseling  Your health care provider may ask you questions about your:  Alcohol use.  Tobacco use.  Drug use.  Emotional well-being.  Home and relationship well-being.  Sexual activity.  Eating habits.  History of falls.  Memory and ability to understand (cognition).  Work and work Astronomer. Screening  You may have the following tests or measurements:  Height, weight, and BMI.  Blood pressure.  Lipid and cholesterol levels. These may be checked every 5 years, or more frequently if you are over 50 years old.  Skin check.  Lung cancer screening. You may have this screening every year starting at age 14 if you have a 30-pack-year history of smoking and currently smoke or have quit within the past 15 years.  Fecal occult blood test (FOBT) of the stool. You may have this test every year starting at age 23.  Flexible sigmoidoscopy or colonoscopy. You may have a sigmoidoscopy every 5 years or a colonoscopy every 10 years starting at age 81.  Prostate cancer screening. Recommendations will vary depending on your family history and other risks.  Hepatitis C blood test.  Hepatitis B blood test.  Sexually transmitted disease (STD) testing.  Diabetes screening. This is done by checking your blood sugar (glucose) after you have not eaten for a while (fasting). You may have this done every 1-3 years.  Abdominal aortic aneurysm (AAA) screening. You may need this if you are a current or former smoker.  Osteoporosis. You may be screened starting at age 35  if you are at high risk. Talk with your health care provider about your test results, treatment options, and if necessary, the need for more tests. Vaccines  Your health care provider may recommend certain vaccines, such as:  Influenza vaccine. This is recommended every  year.  Tetanus, diphtheria, and acellular pertussis (Tdap, Td) vaccine. You may need a Td booster every 10 years.  Zoster vaccine. You may need this after age 57.  Pneumococcal 13-valent conjugate (PCV13) vaccine. One dose is recommended after age 52.  Pneumococcal polysaccharide (PPSV23) vaccine. One dose is recommended after age 45. Talk to your health care provider about which screenings and vaccines you need and how often you need them. This information is not intended to replace advice given to you by your health care provider. Make sure you discuss any questions you have with your health care provider. Document Released: 11/16/2015 Document Revised: 07/09/2016 Document Reviewed: 08/21/2015 Elsevier Interactive Patient Education  2017 [place] Prevention in the Home Falls can cause injuries. They can happen to people of all ages. There are many things you can do to make your home safe and to help prevent falls. What can I do on the outside of my home?  Regularly fix the edges of walkways and driveways and fix any cracks.  Remove anything that might make you trip as you walk through a door, such as a raised step or threshold.  Trim any bushes or trees on the path to your home.  Use bright outdoor lighting.  Clear any walking paths of anything that might make someone trip, such as rocks or tools.  Regularly check to see if handrails are loose or broken. Make sure that both sides of any steps have handrails.  Any raised decks and porches should have guardrails on the edges.  Have any leaves, snow, or ice cleared regularly.  Use sand or salt on walking paths during winter.  Clean up any spills in your garage right away. This includes oil or grease spills. What can I do in the bathroom?  Use night lights.  Install grab bars by the toilet and in the tub and shower. Do not use towel bars as grab bars.  Use non-skid mats or decals in the tub or shower.  If you  need to sit down in the shower, use a plastic, non-slip stool.  Keep the floor dry. Clean up any water that spills on the floor as soon as it happens.  Remove soap buildup in the tub or shower regularly.  Attach bath mats securely with double-sided non-slip rug tape.  Do not have throw rugs and other things on the floor that can make you trip. What can I do in the bedroom?  Use night lights.  Make sure that you have a light by your bed that is easy to reach.  Do not use any sheets or blankets that are too big for your bed. They should not hang down onto the floor.  Have a firm chair that has side arms. You can use this for support while you get dressed.  Do not have throw rugs and other things on the floor that can make you trip. What can I do in the kitchen?  Clean up any spills right away.  Avoid walking on wet floors.  Keep items that you use a lot in easy-to-reach places.  If you need to reach something above you, use a strong step stool that has a grab bar.  Keep electrical  cords out of the way.  Do not use floor polish or wax that makes floors slippery. If you must use wax, use non-skid floor wax.  Do not have throw rugs and other things on the floor that can make you trip. What can I do with my stairs?  Do not leave any items on the stairs.  Make sure that there are handrails on both sides of the stairs and use them. Fix handrails that are broken or loose. Make sure that handrails are as long as the stairways.  Check any carpeting to make sure that it is firmly attached to the stairs. Fix any carpet that is loose or worn.  Avoid having throw rugs at the top or bottom of the stairs. If you do have throw rugs, attach them to the floor with carpet tape.  Make sure that you have a light switch at the top of the stairs and the bottom of the stairs. If you do not have them, ask someone to add them for you. What else can I do to help prevent falls?  Wear shoes  that:  Do not have high heels.  Have rubber bottoms.  Are comfortable and fit you well.  Are closed at the toe. Do not wear sandals.  If you use a stepladder:  Make sure that it is fully opened. Do not climb a closed stepladder.  Make sure that both sides of the stepladder are locked into place.  Ask someone to hold it for you, if possible.  Clearly mark and make sure that you can see:  Any grab bars or handrails.  First and last steps.  Where the edge of each step is.  Use tools that help you move around (mobility aids) if they are needed. These include:  Canes.  Walkers.  Scooters.  Crutches.  Turn on the lights when you go into a dark area. Replace any light bulbs as soon as they burn out.  Set up your furniture so you have a clear path. Avoid moving your furniture around.  If any of your floors are uneven, fix them.  If there are any pets around you, be aware of where they are.  Review your medicines with your doctor. Some medicines can make you feel dizzy. This can increase your chance of falling. Ask your doctor what other things that you can do to help prevent falls. This information is not intended to replace advice given to you by your health care provider. Make sure you discuss any questions you have with your health care provider. Document Released: 08/16/2009 Document Revised: 03/27/2016 Document Reviewed: 11/24/2014 Elsevier Interactive Patient Education  2017 [employer].

## 2021-02-02 ENCOUNTER — Other Ambulatory Visit: Payer: Self-pay | Admitting: Cardiovascular Disease

## 2021-02-04 NOTE — Telephone Encounter (Signed)
LVM for patient to call back. ?

## 2021-02-04 NOTE — Telephone Encounter (Signed)
Please call pt to schedule overdue follow-up with Dr. Kirke Corin for refills. Last seen 12/2019. Thanks!

## 2021-02-05 NOTE — Telephone Encounter (Signed)
Scheduled 4/11 Michaelle Birks

## 2021-02-11 ENCOUNTER — Ambulatory Visit (INDEPENDENT_AMBULATORY_CARE_PROVIDER_SITE_OTHER): Payer: Medicare Other | Admitting: Physician Assistant

## 2021-02-11 ENCOUNTER — Encounter: Payer: Self-pay | Admitting: Physician Assistant

## 2021-02-11 ENCOUNTER — Other Ambulatory Visit: Payer: Self-pay

## 2021-02-11 VITALS — BP 128/72 | HR 67 | Ht 73.0 in | Wt 229.0 lb

## 2021-02-11 DIAGNOSIS — I739 Peripheral vascular disease, unspecified: Secondary | ICD-10-CM

## 2021-02-11 DIAGNOSIS — I872 Venous insufficiency (chronic) (peripheral): Secondary | ICD-10-CM

## 2021-02-11 DIAGNOSIS — I779 Disorder of arteries and arterioles, unspecified: Secondary | ICD-10-CM

## 2021-02-11 DIAGNOSIS — I8312 Varicose veins of left lower extremity with inflammation: Secondary | ICD-10-CM

## 2021-02-11 DIAGNOSIS — I2510 Atherosclerotic heart disease of native coronary artery without angina pectoris: Secondary | ICD-10-CM | POA: Diagnosis not present

## 2021-02-11 DIAGNOSIS — I6522 Occlusion and stenosis of left carotid artery: Secondary | ICD-10-CM

## 2021-02-11 DIAGNOSIS — I5032 Chronic diastolic (congestive) heart failure: Secondary | ICD-10-CM

## 2021-02-11 DIAGNOSIS — I252 Old myocardial infarction: Secondary | ICD-10-CM | POA: Diagnosis not present

## 2021-02-11 DIAGNOSIS — I480 Paroxysmal atrial fibrillation: Secondary | ICD-10-CM

## 2021-02-11 DIAGNOSIS — Z79899 Other long term (current) drug therapy: Secondary | ICD-10-CM

## 2021-02-11 DIAGNOSIS — E781 Pure hyperglyceridemia: Secondary | ICD-10-CM

## 2021-02-11 DIAGNOSIS — I6502 Occlusion and stenosis of left vertebral artery: Secondary | ICD-10-CM

## 2021-02-11 DIAGNOSIS — I8311 Varicose veins of right lower extremity with inflammation: Secondary | ICD-10-CM

## 2021-02-11 DIAGNOSIS — E785 Hyperlipidemia, unspecified: Secondary | ICD-10-CM

## 2021-02-11 DIAGNOSIS — I6529 Occlusion and stenosis of unspecified carotid artery: Secondary | ICD-10-CM

## 2021-02-11 NOTE — Progress Notes (Signed)
Office Visit    Patient Name: Gilbert Coleman Date of Encounter: 02/11/2021  PCP:  Malva Limes, MD   [facility]  Cardiologist:  Lorine Bears, MD  Advanced Practice Provider:  No care team member to display Electrophysiologist:  None   Chief Complaint    Chief Complaint  Patient presents with  . Follow-up    Annual follow up. Medications verbally reviewed with patient.     70 y.o. male with history of lateral ST elevation myocardial infarction complicated by ventricular fibrillation requiring multiple shocks, LHC s/p angioplasty and DES to the mid left circumflex, EF 40 to 45%, mild MR, brief post MI A. fib without recurrent arrhythmia, and here today for annual / 46-month follow-up.  Past Medical History    Past Medical History:  Diagnosis Date  . CAD (coronary artery disease)    a. LM nl, LAD 30m, 20d, D1/D2 min irregs, LCX 85m (3.0x15 Xience Alpine DES), OM1/2 nl, OM3 30ost, RCA 70p/m, RPDA /RPL1/RPL2 nl, EF 45-50%-->admisison complicated by recurrent VF requiring Defib and amio.  Marland Kitchen History of chicken pox   . History of measles   . Hyperlipidemia   . Hypertension   . Ischemic cardiomyopathy    a. 10/2015 Echo: EF 40-45%, sev inflat HK, mild MR, mildly dil LA, PASP .  . Morbid obesity (HCC)   . PAF (paroxysmal atrial fibrillation) (HCC)    a. At time of STEMI in setting of recurrent VF/defib/ACLS meds-->Amio.   Past Surgical History:  Procedure Laterality Date  . CARDIAC CATHETERIZATION N/A 10/26/2015   Procedure: Left Heart Cath and Coronary Angiography;  Surgeon: Iran Ouch, MD;  Location: [facility] INVASIVE CV LAB;  Service: Cardiovascular;  Laterality: N/A;  . CARDIAC CATHETERIZATION N/A 10/26/2015   Procedure: Coronary Stent Intervention;  Surgeon: Iran Ouch, MD;  Location: [facility] INVASIVE CV LAB;  Service: Cardiovascular;  Laterality: N/A;  . CATARACT EXTRACTION Bilateral   . colon polyps    . COLONOSCOPY  06/21/2014    Diminutive polyps. Tubular ademona, Hyperplastic polyps. Repeat 06/2019  . COLONOSCOPY WITH PROPOFOL N/A 02/13/2020   Procedure: COLONOSCOPY WITH PROPOFOL;  Surgeon: Toledo, Boykin Nearing, MD;  Location: [facility] ENDOSCOPY;  Service: Gastroenterology;  Laterality: N/A;  . CORONARY ANGIOPLASTY     stent/2016  . FINGER SURGERY      Allergies  Allergies  Allergen Reactions  . Atorvastatin     Other reaction(s): Muscle Pain  . Ciprofloxacin Diarrhea and Nausea Only    History of Present Illness    Gilbert Coleman is a 70 y.o. male with PMH as above.    He has history of CAD with 10/2015 inferolateral ST elevation myocardial infarction, complicated by VF that required multiple shocks.  Subsequent emergent catheterization showed severe thrombotic dz of mLCx with 70%s / diffuse of pRCA.  He underwent successful angioplasty and DES to mLCx.    Subsequent echo showed EF 40 to 45% with mild MR.  He had brief post stenting atrial fibrillation with no evidence of recurrent arrhythmia after that time.    2017 treadmill nuclear stress test showed possible mild apical ischemia with normal EF.  No ST deviation was noted with exercise, although frequent PVCs noted with exercise.  No evidence of inferior wall ischemia.    He has known occluded left vertebral artery without significant obstructive disease affecting his carotid arteries.  12/2015 carotid performed with left vertebral artery showing decreased diastolic flow with possible reversal.  Concern noted  for left vertebral origin stenosis and vertebrobasilar symptoms with recommendation for contrast-enhanced CTA..  CTA neck 03/11/2016 showed left vertebral artery occluded at the origin with faint reconstitution of a very small left vertebral artery, which continuous to the skull base.  It was noted this could be due to atherosclerotic disease of the infection.  Right vertebral artery was widely patent and normal.  Mild atherosclerotic plaque of LICA noted with  25%s/ diameter stenosis.  No significant right carotid stenosis.  Most recent 01/02/2020 echo with EF 55-60%, NRWMA, mild LAE/RAE, mild MR, mild to moderate AV sclerosis without evidence of stenosis, RAP 3 mmHg.  He was last seen in clinic 12/20/2019 by his primary cardiologist, Dr. Kirke [name].  At that time, he was noted to be doing well with need for routine colonoscopy in the near future.  It was noted he could not afford Vascepa due to cost.  He was thus taking over-the-counter fish oil  Today, 02/11/2021, he returns to clinic and notes he is overall doing well from a cardiac standpoint.  He denies any chest pain, racing heart rate, presyncope, syncope, early satiety, PND, LEE, orthopnea, weight gain, or other signs or symptoms of volume overload.  Since his last visit, he reports a lot of problems, which occurred a month or so ago.  He does report progressive tinnitus and periodic headache without amaurosis fugax with known history of occluded vertebral artery. He notes pain with ambulation located in the bottom of his feet while walking, especially on sand. He notes calf pain. On further questioning, calf pain starts s/p ambulation rather than with ambulation and described as sorenessl.  He notes swelling and painful tortuous veins -- noting pain over several lower extremity tortuous veins on both thighs and calves TODAY.  Tortuous lower extremity veins are associated with "an itchy feeling."  He is interested in updating scans of his carotids / vertebral arteries and arterial/venous US of his bilateral LE given his recent sx. He indicates preference for all scans at the same time, rather than spaced out.  He denies any signs or symptoms of bleeding.  He reports medication compliance.  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  He very much enjoys his hobby.  Home Medications    Current Outpatient  Medications on File Prior to Visit  Medication Sig Dispense Refill  . aspirin 81 MG tablet Take 1 tablet by mouth daily.    . cholecalciferol (VITAMIN D) 25 MCG (1000 UNIT) tablet Take 2,000 Units by mouth daily.    Marland Kitchen. docusate sodium (COLACE) 100 MG capsule Take 100 mg by mouth 2 (two) times daily.    Marland Kitchen. latanoprost (XALATAN) 0.005 % ophthalmic solution Place 1 drop into both eyes at bedtime.    Marland Kitchen. lisinopril (ZESTRIL) 5 MG tablet TAKE ONE TABLET BY MOUTH DAILY 90 tablet 0  . metoprolol tartrate (LOPRESSOR) 25 MG tablet TAKE ONE TABLET BY MOUTH TWICE A DAY 180 tablet 0  . Misc Natural Products (GLUCOSAMINE CHONDROITIN COMPLX) TABS Take 1 tablet by mouth daily.    . Multiple Vitamin (MULTIVITAMIN WITH MINERALS) TABS tablet Take 1 tablet by mouth daily.    . nitroGLYCERIN (NITROSTAT) 0.4 MG SL tablet Place 1 tablet (0.4 mg total) under the tongue every 5 (five) minutes as needed for chest pain. 25 tablet 3  . Omega-3 Fatty Acids (FISH OIL) 1000 MG CAPS Take 2 capsules (  2,000 mg total) by mouth daily. 60 capsule 0  . psyllium (METAMUCIL) 58.6 % powder Take 1 packet by mouth daily.    . rosuvastatin (CRESTOR) 20 MG tablet TAKE ONE TABLET BY MOUTH DAILY 90 tablet 0  . vitamin B-12 (CYANOCOBALAMIN) 1000 MCG tablet Take 1,000 mcg by mouth daily.     No current facility-administered medications on file prior to visit.    Review of Systems    He denies chest pain, palpitations, dyspnea, pnd, orthopnea, n, v, dizziness, syncope, weight gain, or early satiety.  He reports pain at the bottom of his bilateral feet with ambulation, which is reportedly worse on soft sand.  He reports a soreness that occurs in his bilateral calves but only after ambulation, rather than during.  He reports bilateral lower extremity venous swelling/tortuous venous areas that are itchy and uncomfortable, worsening over the past few months.  He reports bilateral lower extremity edema associated with these findings..   All other  systems reviewed and are otherwise negative except as noted above.  Physical Exam    VS:  BP 128/72 (BP Location: Left Arm, Patient Position: Sitting, Cuff Size: Normal)   Pulse 67   Ht 6\' 1"  (1.854 m)   Wt 229 lb (103.9 kg)   SpO2 96%   BMI 30.21 kg/m  , BMI Body mass index is 30.21 kg/m. GEN: Well nourished, well developed, in no acute distress.  Facemask in place. HEENT: normal. Neck: Supple, no JVD, carotid bruits, or masses. Cardiac: RRR, no murmurs, rubs, or gallops. No clubbing, cyanosis.  Mild bilateral nonpitting edema with tortuous bilateral veins noted at areas of patient reported itchiness or uncomfortable feeling.  Radials/DP/PT 2+ and equal bilaterally.  Respiratory:  Respirations regular and unlabored, clear to auscultation bilaterally. GI: Soft, nontender, nondistended, BS + x 4. MS: no deformity or atrophy. Skin: warm and dry, no rash. Neuro:  Strength and sensation are intact. Psych: Normal affect.  Accessory Clinical Findings    ECG personally reviewed by me today - NSR, 67 BPM- no acute changes.  VITALS Reviewed today   Temp Readings from Last 3 Encounters:  12/05/20 98.6 F (37 C) (Oral)  02/13/20 (!) 96.7 F (35.9 C) (Temporal)  04/10/16 98.1 F (36.7 C) (Oral)   BP Readings from Last 3 Encounters:  02/11/21 128/72  12/05/20 129/71  02/13/20 (!) 102/51   Pulse Readings from Last 3 Encounters:  02/11/21 67  12/05/20 74  02/13/20 66    Wt Readings from Last 3 Encounters:  02/11/21 229 lb (103.9 kg)  12/05/20 234 lb 12.8 oz (106.5 kg)  02/13/20 239 lb (108.4 kg)     LABS  reviewed today   Lab Results  Component Value Date   WBC 8.5 01/02/2020   HGB 15.8 01/02/2020   HCT 45.3 01/02/2020   MCV 91.0 01/02/2020   PLT 230 01/02/2020   Lab Results  Component Value Date   CREATININE 0.90 01/02/2020   BUN 16 01/02/2020   NA 143 01/02/2020   K 4.0 01/02/2020   CL 101 01/02/2020   CO2 26 01/02/2020   Lab Results  Component Value Date    ALT 26 01/02/2020   AST 25 01/02/2020   ALKPHOS 53 01/02/2020   BILITOT 1.0 01/02/2020   Lab Results  Component Value Date   CHOL 122 01/02/2020   HDL 31 (L) 01/02/2020   LDLCALC 54 01/02/2020   TRIG 184 (H) 01/02/2020   CHOLHDL 3.9 01/02/2020    Lab Results  Component  Value Date   HGBA1C 5.6 02/22/2016   Lab Results  Component Value Date   TSH 1.47 08/31/2014     STUDIES/PROCEDURES reviewed today   Echocardiogram 01/02/2020 Left ventricular ejection fraction, by estimation, is 55 to 60%. The  left ventricle has normal function. The left ventricle has no regional  wall motion abnormalities. Left ventricular diastolic parameters were  normal.  2. Right ventricular systolic function is normal. The right ventricular  size is normal. Tricuspid regurgitation signal is inadequate for assessing  PA pressure.  3. Left atrial size was mildly dilated.  4. Right atrial size was mildly dilated.  5. The mitral valve is normal in structure and function. Mild mitral  valve regurgitation. No evidence of mitral stenosis.  6. The aortic valve is normal in structure and function. Aortic valve  regurgitation is not visualized. Mild to moderate aortic valve  sclerosis/calcification is present, without any evidence of aortic  stenosis.  7. The inferior vena cava is normal in size with greater than 50%  respiratory variability, suggesting right atrial pressure of 3 mmHg.  Comparison(s): EF 40-45%, A-fib history.   Carotid / CTA head and neck  03/11/2016 IMPRESSION: Left vertebral artery occluded at the origin with faint reconstitution of a very small left vertebral artery which continues to the skullbase. This could be due to atherosclerotic disease or dissection. Right vertebral artery widely patent and normal Mild atherosclerotic plaque proximal left internal carotid artery with 25% diameter stenosis. No significant right carotid stenosis.  Bilateral ultrasound/carotid  study 02/28/2016 IMPRESSION: Color duplex indicates minimal heterogeneous plaque, with no hemodynamically significant stenosis by duplex criteria in the extracranial cerebrovascular circulation. Right vertebral artery patent with antegrade flow. The left vertebral artery demonstrates decreased diastolic flow or possible reversal. If there is concern for left vertebral origin stenosis and vertebrobasilar symptoms, further evaluation with contrast enhanced CTA may be useful.  12/27/2015 MPI  Blood pressure demonstrated a hypertensive response to exercise.  There was no ST segment deviation noted during stress.  Defect 1: There is a small defect of mild severity present in the apex location.  This is a low risk study.  Findings consistent with mild apical ischemia.  The left ventricular ejection fraction is normal (55-65%).  Frequent PVCs with exercise.  LHC 10/26/2015  Prox RCA to Mid RCA lesion, 70% stenosed.  Mid LAD lesion, 30% stenosed.  Dist LAD lesion, 20% stenosed.  Ost 3rd Mrg to 3rd Mrg lesion, 30% stenosed.  Mid Cx lesion, 99% stenosed. Post intervention, there is a 0% residual stenosis.  There is mild left ventricular systolic dysfunction. 1. Significant 2 vessel coronary artery disease. The culprit for myocardial infarction is subtotal thrombotic occlusion of the mid left circumflex. 2. Mildly reduced LV systolic function with an ejection fraction of 45%. Mildly elevated left ventricular end-diastolic pressure. 3. Successful angioplasty and drug-eluting stent placement to the mid left circumflex. Recommendations: Dual antiplatelet therapy for at least one year. Aggressive treatment of risk factors. Consider stress testing in 4-6 weeks to evaluate for ischemia in the RCA distribution.   Assessment & Plan    Coronary artery disease without angina --Doing well without any reported symptoms concerning for angina.  No indication for further ischemic work-up at  this time.  Recommend ongoing aggressive risk factor modification.  Reviewed diet and lifestyle/exercise today in detail.  Continue current medications.  Chronic HFpEF, h/o ICM --Denies any signs or symptoms of volume overload.  Following his myocardial infarction, EF 40 to 45% with recovery of  EF on most recent echo 55 to 60%.  Euvolemic and well compensated on exam today.  Continue current medications.  HLD, LDL goal below 70 Hypertriglyceridemia --Most recent 01/2020 labs showed LDL 54 and triglycerides 24.  Will update both lipid and liver function today.  Continue current dose rosuvastatin for now.  He has not been able to start Vascepa due to cost with ongoing elevated triglycerides, discussed in detail today.  Continue over-the-counter fish oil.  Recommend consider PCSK9 inhibitors or) in the near future, as discussed with patient.  Per patient preference, will defer for now.  Further recommendations pending updated studies of carotids, vertebral arteries, and lower extremities.  Occlusion of left vertebral artery --Reports tinnitus and progressive headache/dizziness.  Previous ultrasound and CT as above.  Will order repeat studies today.  Recommend ongoing risk factor modification.  Glycemic and cholesterol control recommended, as well as heart rate and BP control.  Further recommendations following updating studies.  Carotid artery disease, LICA stenosis --History of carotid artery disease/LICA stenosis with most recent imaging as well.  Denies progressive amaurosis fugax but does note progressive headache/dizziness/tinnitus as above.  Given his recent progressive symptoms, will update carotid study.  Recommend ongoing risk factor modification as above, and occluding glycemic and cholesterol control.  Recommend heart rate and BP control.  Lower extremity pain/ varicose veins /venous swelling Atypical claudication symptoms. --Reports significant swelling and lower extremity pain associated  with a tortuous veins noted on exam today with associated swelling.  Exam significant for varicose veins.  He reports atypical claudication symptoms, as well as pain when walking on soft sand but not thought of heart strain.  Patient request both arterial and venous studies and to occur at the same time as his carotid study.  Will obtain all studies on the same day if possible, in the meantime, recommend aggressive risk factor modification as outlined in A/P above.   Medication changes: None Labs ordered: Lipid and liver panel Studies / Imaging ordered: Carotid ultrasound, lower extremity venous and arterial studies Future considerations: Pending results of lipid/liver and LE venous, LE arterial, and carotid studies Disposition: RTC 12 mo  *Please be aware that the above documentation was completed voice recognition software and may contain associated dictation errors.     Lennon Alstrom, PA-C 02/11/2021

## 2021-02-11 NOTE — Patient Instructions (Signed)
Medication Instructions:  Your physician recommends that you continue on your current medications as directed. Please refer to the Current Medication list given to you today.  *If you need a refill on your cardiac medications before your next appointment, please call your pharmacy*   Lab Work: Your physician recommends that you have lab work TODAY: Lipid, Liver panel  If you have labs (blood work) drawn today and your tests are completely normal, you will receive your results only by: Marland Kitchen MyChart Message (if you have MyChart) OR . A paper copy in the mail If you have any lab test that is abnormal or we need to change your treatment, we will call you to review the results.   Testing/Procedures:  1) Your physician has requested that you have a lower extremity arterial exercise duplex. During this test, exercise and ultrasound are used to evaluate arterial blood flow in the legs. Allow one hour for this exam. There are no restrictions or special instructions.  2) Your physician has requested that you have a lower extremity venous duplex. This test is an ultrasound of the veins in the legs or arms. It looks at venous blood flow that carries blood from the heart to the legs or arms. Allow one hour for a Lower Venous exam. Allow thirty minutes for an Upper Venous exam. There are no restrictions or special instructions.  3) Your physician has requested that you have a carotid duplex. This test is an ultrasound of the carotid arteries in your neck. It looks at blood flow through these arteries that supply the brain with blood. Allow one hour for this exam. There are no restrictions or special instructions.    Follow-Up: At [facility], you and your health needs are our priority.  As part of our continuing mission to provide you with exceptional heart care, we have created designated Provider Care Teams.  These Care Teams include your primary Cardiologist (physician) and Advanced Practice Providers  (APPs -  Physician Assistants and Nurse Practitioners) who all work together to provide you with the care you need, when you need it.  We recommend signing up for the patient portal called "MyChart".  Sign up information is provided on this After Visit Summary.  MyChart is used to connect with patients for Virtual Visits (Telemedicine).  Patients are able to view lab/test results, encounter notes, upcoming appointments, etc.  Non-urgent messages can be sent to your provider as well.   To learn more about what you can do with MyChart, go to [email or web address].    Your next appointment:   1 year(s)  The format for your next appointment:   In Person  Provider:   You may see Lorine Bears, MD or one of the following Advanced Practice Providers on your designated Care Team:     Marisue Ivan, [place]

## 2021-02-12 LAB — HEPATIC FUNCTION PANEL
ALT: 25 IU/L (ref 0–44)
AST: 20 IU/L (ref 0–40)
Albumin: 4.8 g/dL (ref 3.8–4.8)
Alkaline Phosphatase: 73 IU/L (ref 44–121)
Bilirubin Total: 0.4 mg/dL (ref 0.0–1.2)
Bilirubin, Direct: 0.13 mg/dL (ref 0.00–0.40)
Total Protein: 7.1 g/dL (ref 6.0–8.5)

## 2021-02-12 LAB — LIPID PANEL
Chol/HDL Ratio: 3.9 ratio (ref 0.0–5.0)
Cholesterol, Total: 130 mg/dL (ref 100–199)
HDL: 33 mg/dL — ABNORMAL LOW (ref >39)
LDL Chol Calc (NIH): 62 mg/dL (ref 0–99)
Triglycerides: 210 mg/dL — ABNORMAL HIGH (ref 0–149)
VLDL Cholesterol Cal: 35 mg/dL (ref 5–40)

## 2021-02-13 ENCOUNTER — Telehealth: Payer: Self-pay | Admitting: *Deleted

## 2021-02-13 DIAGNOSIS — E785 Hyperlipidemia, unspecified: Secondary | ICD-10-CM

## 2021-02-13 DIAGNOSIS — Z79899 Other long term (current) drug therapy: Secondary | ICD-10-CM

## 2021-02-13 NOTE — Telephone Encounter (Signed)
Notified pt of lab results and provider's recc.  Pt did want to let us know that he was NOT fasting at time of lab work.  He would like to repeat fasting labs prior to making changes in medication.   Pt is taking Rosuvastatin 20mg  daily. Wanted to clarify med change dosage otherwise.   And pt does confirm that Vascepa is still too expensive and cannot take at this time.   Please advise if pt may repeat lipid panel and will be fasting.

## 2021-02-13 NOTE — Telephone Encounter (Addendum)
-----   Message from Lennon Alstrom, PA-C sent at 02/13/2021  6:38 AM EDT ----- Labs show --Total Tg still elevated and slightly worse from previous studies from 184 --> 210.  --LDL at goal of below 70 and currently 62. --LFTs stable from previous labs and wnl.   Recommendations: --He has previously not been able to afford Vascepa. If this is still the case, and given worsening TG, recommend increase to Crestor 40mg  if agreeable. Repeat Lipids/Liver in 6-8 week, and if Tg still not at goal, consider PharmD referral for PCSK9i or Incliseran at that time.

## 2021-02-14 NOTE — Telephone Encounter (Signed)
He is welcome to repeat a fasting lipid panel; however, I do not foresee much of a change in his numbers.  As he is unable to afford Vascepa, we can have him increase to rosuvastatin 40 mg daily if repeat labs show ongoing elevated numbers.

## 2021-02-21 NOTE — Telephone Encounter (Signed)
Spoke to pt, notified of provider's recc below.  He agrees to have repeat fasting lipid panel. Asks to have lab order printed so he can have done at [facility].  Orders printed and placed at front desk for pick up.  Will call pt with results and he understands we will discuss plan after results return.

## 2021-02-25 ENCOUNTER — Ambulatory Visit (INDEPENDENT_AMBULATORY_CARE_PROVIDER_SITE_OTHER): Payer: Medicare Other | Admitting: Family Medicine

## 2021-02-25 ENCOUNTER — Encounter: Payer: Self-pay | Admitting: Family Medicine

## 2021-02-25 ENCOUNTER — Other Ambulatory Visit: Payer: Self-pay

## 2021-02-25 VITALS — BP 125/59 | HR 67 | Temp 99.0°F | Resp 16 | Ht 73.0 in | Wt 226.6 lb

## 2021-02-25 DIAGNOSIS — E781 Pure hyperglyceridemia: Secondary | ICD-10-CM | POA: Diagnosis not present

## 2021-02-25 DIAGNOSIS — I480 Paroxysmal atrial fibrillation: Secondary | ICD-10-CM

## 2021-02-25 DIAGNOSIS — Z23 Encounter for immunization: Secondary | ICD-10-CM | POA: Diagnosis not present

## 2021-02-25 DIAGNOSIS — I6522 Occlusion and stenosis of left carotid artery: Secondary | ICD-10-CM

## 2021-02-25 DIAGNOSIS — Z1159 Encounter for screening for other viral diseases: Secondary | ICD-10-CM

## 2021-02-25 DIAGNOSIS — E785 Hyperlipidemia, unspecified: Secondary | ICD-10-CM | POA: Diagnosis not present

## 2021-02-25 DIAGNOSIS — Z125 Encounter for screening for malignant neoplasm of prostate: Secondary | ICD-10-CM

## 2021-02-25 NOTE — Patient Instructions (Addendum)
Although Covid boosters don't prevent all Covid infections, they are very effective at preventing severe and life threatening infections. I strongly recommend Covid boosters if it's been more than 6 months since your last Covid shot.

## 2021-02-25 NOTE — Progress Notes (Signed)
Established patient visit   Patient: Gilbert Coleman   DOB: 1951/04/05   70 y.o. Male  MRN: [id number] Visit Date: 02/25/2021  Today's healthcare provider: Mila Merry, MD   Chief Complaint  Patient presents with  . Congestive Heart Failure  . Hyperlipidemia   Subjective    HPI  Had AWV with [facility] on 01/02/2021  Follow up for CAD:  This problem is managed by cardiologist Dr. Kirke Corin.  He reports excellent compliance with treatment. He feels that condition is Improved. He is not having side effects.  -----------------------------------------------------------------------------------------   Lipid/Cholesterol, Follow-up  Last lipid panel Other pertinent labs  Lab Results  Component Value Date   CHOL 130 02/11/2021   HDL 33 (L) 02/11/2021   LDLCALC 62 02/11/2021   TRIG 210 (H) 02/11/2021   CHOLHDL 3.9 02/11/2021   Lab Results  Component Value Date   ALT 25 02/11/2021   AST 20 02/11/2021   PLT 230 01/02/2020   TSH 1.47 08/31/2014     This problem is managed by cardiologist Dr. Kirke Corin.   He reports excellent compliance with treatment. He is not having side effects.  Symptoms: No chest pain No chest pressure/discomfort  No dyspnea No lower extremity edema  No numbness or tingling of extremity No orthopnea  No palpitations No paroxysmal nocturnal dyspnea  No speech difficulty No syncope   Current diet: in general, a "healthy" diet   Current exercise: cardiovascular workout on exercise equipment  The ASCVD Risk score Denman George DC Jr., et al., 2013) failed to calculate for the following reasons:   The patient has a prior MI or stroke diagnosis  ---------------------------------------------------------------------------------------------------  Patient Active Problem List   Diagnosis Date Noted  . Carotid artery disease (HCC) 04/25/2016  . Rectal bleeding 04/17/2016  . Rectal pain 04/17/2016  . Cataract 02/22/2016  . Glaucoma 02/22/2016  . Allergic  rhinitis 02/21/2016  . Hypertriglyceridemia 02/21/2016  . Fam hx-ischem heart disease 02/21/2016  . H/O adenomatous polyp of colon 02/21/2016  . Numbness 02/21/2016  . PAF (paroxysmal atrial fibrillation) (HCC)   . Morbid obesity (HCC)   . Hyperlipidemia   . Ischemic cardiomyopathy   . CAD (coronary artery disease)   . ST elevation myocardial infarction (STEMI) of inferior wall (HCC) 10/26/2015   Past Surgical History:  Procedure Laterality Date  . CARDIAC CATHETERIZATION N/A 10/26/2015   Procedure: Left Heart Cath and Coronary Angiography;  Surgeon: Iran Ouch, MD;  Location: [facility]IVE CV [facility];  Service: Cardiovascular;  Laterality: N/A;  . CARDIAC CATHETERIZATION N/A 10/26/2015   Procedure: Coronary Stent Intervention;  Surgeon: Iran Ouch, MD;  Location: [facility] CV [facility];  Service: Cardiovascular;  Laterality: N/A;  . CATARACT EXTRACTION Bilateral   . colon polyps    . COLONOSCOPY  06/21/2014   Diminutive polyps. Tubular ademona, Hyperplastic polyps. Repeat 06/2019  . COLONOSCOPY WITH PROPOFOL N/A 02/13/2020   Procedure: COLONOSCOPY WITH PROPOFOL;  Surgeon: Toledo, Boykin Nearing, MD;  Location: [facility] ENDOSCOPY;  Service: Gastroenterology;  Laterality: N/A;  . CORONARY ANGIOPLASTY     stent/2016  . FINGER SURGERY     Social History   Socioeconomic History  . Marital status: Married    Spouse name: Not on file  . Number of children: 1  . Years of education: Not on file  . Highest education level: Associate degree: occupational, Scientist, product/process development, or vocational program  Occupational History  . Occupation: retired  Tobacco Use  . Smoking status: Former Smoker  Packs/day: 0.25    Years: 30.00    Pack years: 7.50    Types: Cigars    Quit date: 11/03/2009    Years since quitting: 11.3  . Smokeless tobacco: Never Used  . Tobacco comment: chews nicorette  Vaping Use  . Vaping Use: Never used  Substance and Sexual Activity  . Alcohol use: Yes    Alcohol/week: 0.0  standard drinks    Comment: occasional use  . Drug use: No  . Sexual activity: Not on file  Other Topics Concern  . Not on file  Social History Narrative  . Not on file   Social Determinants of Health   Financial Resource Strain: Low Risk   . Difficulty of Paying Living Expenses: Not hard at all  Food Insecurity: No Food Insecurity  . Worried About Programme researcher, broadcasting/film/video in the Last Year: Never true  . Ran Out of Food in the Last Year: Never true  Transportation Needs: No Transportation Needs  . Lack of Transportation (Medical): No  . Lack of Transportation (Non-Medical): No  Physical Activity: Insufficiently Active  . Days of Exercise per Week: 3 days  . Minutes of Exercise per Session: 30 min  Stress: Unknown  . Feeling of Stress : Patient refused  Social Connections: Moderately Integrated  . Frequency of Communication with Friends and Family: Twice a week  . Frequency of Social Gatherings with Friends and Family: More than three times a week  . Attends Religious Services: Never  . Active Member of Clubs or Organizations: Yes  . Attends Banker Meetings: More than 4 times per year  . Marital Status: Married  Catering manager Violence: Not At Risk  . Fear of Current or Ex-Partner: No  . Emotionally Abused: No  . Physically Abused: No  . Sexually Abused: No   Family History  Problem Relation Age of Onset  . Heart disease Father   . Heart disease Brother   . Heart disease Brother   . Alzheimer's disease Mother    Allergies  Allergen Reactions  . Atorvastatin     Other reaction(s): Muscle Pain  . Ciprofloxacin Diarrhea and Nausea Only       Medications: Outpatient Medications Prior to Visit  Medication Sig  . aspirin 81 MG tablet Take 1 tablet by mouth daily.  . cholecalciferol (VITAMIN D) 25 MCG (1000 UNIT) tablet Take 2,000 Units by mouth daily.  Marland Kitchen docusate sodium (COLACE) 100 MG capsule Take 100 mg by mouth 2 (two) times daily.  Marland Kitchen latanoprost  (XALATAN) 0.005 % ophthalmic solution Place 1 drop into both eyes at bedtime.  Marland Kitchen lisinopril (ZESTRIL) 5 MG tablet TAKE ONE TABLET BY MOUTH DAILY  . metoprolol tartrate (LOPRESSOR) 25 MG tablet TAKE ONE TABLET BY MOUTH TWICE A DAY  . Misc Natural Products (GLUCOSAMINE CHONDROITIN COMPLX) TABS Take 1 tablet by mouth daily.  . Multiple Vitamin (MULTIVITAMIN WITH MINERALS) TABS tablet Take 1 tablet by mouth daily.  . nitroGLYCERIN (NITROSTAT) 0.4 MG SL tablet Place 1 tablet (0.4 mg total) under the tongue every 5 (five) minutes as needed for chest pain.  . Omega-3 Fatty Acids (FISH OIL) 1000 MG CAPS Take 2 capsules (2,000 mg total) by mouth daily.  . psyllium (METAMUCIL) 58.6 % powder Take 1 packet by mouth daily.  . rosuvastatin (CRESTOR) 20 MG tablet TAKE ONE TABLET BY MOUTH DAILY  . saw palmetto 500 MG capsule Take 500 mg by mouth daily.  . vitamin B-12 (CYANOCOBALAMIN) 1000 MCG tablet Take  1,000 mcg by mouth daily.   No facility-administered medications prior to visit.    Review of Systems  All other systems reviewed and are negative.   Last CBC Lab Results  Component Value Date   WBC 8.5 01/02/2020   HGB 15.8 01/02/2020   HCT 45.3 01/02/2020   MCV 91.0 01/02/2020   MCH 31.7 01/02/2020   RDW 12.4 01/02/2020   PLT 230 01/02/2020   Last metabolic panel Lab Results  Component Value Date   GLUCOSE 107 (H) 01/02/2020   NA 143 01/02/2020   K 4.0 01/02/2020   CL 101 01/02/2020   CO2 26 01/02/2020   BUN 16 01/02/2020   CREATININE 0.90 01/02/2020   GFRNONAA >60 01/02/2020   GFRAA >60 01/02/2020   CALCIUM 9.5 01/02/2020   PHOS 2.6 10/28/2015   PROT 7.1 02/11/2021   ALBUMIN 4.8 02/11/2021   BILITOT 0.4 02/11/2021   ALKPHOS 73 02/11/2021   AST 20 02/11/2021   ALT 25 02/11/2021   ANIONGAP 16 (H) 01/02/2020   Last lipids Lab Results  Component Value Date   CHOL 130 02/11/2021   HDL 33 (L) 02/11/2021   LDLCALC 62 02/11/2021   TRIG 210 (H) 02/11/2021   CHOLHDL 3.9  02/11/2021   Last hemoglobin A1c Lab Results  Component Value Date   HGBA1C 5.6 02/22/2016   Last thyroid functions Lab Results  Component Value Date   TSH 1.47 08/31/2014       Objective    BP (!) 125/59 (BP Location: Right Arm, Patient Position: Sitting, Cuff Size: Large)   Pulse 67   Temp 99 F (37.2 C) (Temporal)   Resp 16   Ht 6\' 1"  (1.854 m)   Wt 226 lb 9.6 oz (102.8 kg)   SpO2 98%   BMI 29.90 kg/m  BP Readings from Last 3 Encounters:  02/25/21 (!) 125/59  02/11/21 128/72  12/05/20 129/71   Wt Readings from Last 3 Encounters:  02/25/21 226 lb 9.6 oz (102.8 kg)  02/11/21 229 lb (103.9 kg)  12/05/20 234 lb 12.8 oz (106.5 kg)       Physical Exam    General: Appearance:     Well developed, well nourished male in no acute distress  Eyes:    PERRL, conjunctiva/corneas clear, EOM's intact       Lungs:     Clear to auscultation bilaterally, respirations unlabored  Heart:    Normal heart rate. Regular rhythm. No murmurs, rubs, or gallops.   MS:   All extremities are intact.   Neurologic:   Awake, alert, oriented x 3. No apparent focal neurological           defect.         Assessment & Plan     1. Hypertriglyceridemia Recent triglycerides above goal, but he states he wasn't really fasting. His cardiologist want to have them rechecked while fasting.  - Renal function panel  2. PAF (paroxysmal atrial fibrillation) (HCC) Current in sinus rhythm. Followed by cardiology.   3. Hyperlipidemia, unspecified hyperlipidemia type He is tolerating rosuvastatin well with no adverse effects.  .  - Lipid panel - TSH  4. Need for hepatitis C screening test  - Hepatitis C antibody  5. Prostate cancer screening  - PSA Total (Reflex To Free) ([facility] only)  6. Need for vaccination against Streptococcus pneumoniae  - Pneumococcal polysaccharide vaccine 23-valent        The entirety of the information documented in the History of Present Illness, Review of  Systems  and Physical Exam were personally obtained by me. Portions of this information were initially documented by the CMA and reviewed by me for thoroughness and accuracy.      Mila Merry, MD  [facility] [phone] (phone) [phone] (fax)  [facility]

## 2021-02-26 LAB — HEPATITIS C ANTIBODY: Hep C Virus Ab: <0.1 s/co ratio (ref 0.0–0.9)

## 2021-02-26 LAB — RENAL FUNCTION PANEL
Albumin: 5 g/dL — ABNORMAL HIGH (ref 3.8–4.8)
BUN/Creatinine Ratio: 20 (ref 10–24)
BUN: 20 mg/dL (ref 8–27)
CO2: 22 mmol/L (ref 20–29)
Calcium: 9.6 mg/dL (ref 8.6–10.2)
Chloride: 102 mmol/L (ref 96–106)
Creatinine, Ser: 0.99 mg/dL (ref 0.76–1.27)
Glucose: 91 mg/dL (ref 65–99)
Phosphorus: 2.7 mg/dL — ABNORMAL LOW (ref 2.8–4.1)
Potassium: 4.3 mmol/L (ref 3.5–5.2)
Sodium: 141 mmol/L (ref 134–144)
eGFR: 83 mL/min/{1.73_m2} (ref >59)

## 2021-02-26 LAB — LIPID PANEL
Chol/HDL Ratio: 3.5 ratio (ref 0.0–5.0)
Cholesterol, Total: 115 mg/dL (ref 100–199)
HDL: 33 mg/dL — ABNORMAL LOW (ref >39)
LDL Chol Calc (NIH): 58 mg/dL (ref 0–99)
Triglycerides: 133 mg/dL (ref 0–149)
VLDL Cholesterol Cal: 24 mg/dL (ref 5–40)

## 2021-02-26 LAB — TSH: TSH: 0.968 u[IU]/mL (ref 0.450–4.500)

## 2021-02-26 LAB — PSA TOTAL (REFLEX TO FREE): Prostate Specific Ag, Serum: 1 ng/mL (ref 0.0–4.0)

## 2021-02-27 ENCOUNTER — Other Ambulatory Visit: Payer: Self-pay | Admitting: Physician Assistant

## 2021-02-27 DIAGNOSIS — I739 Peripheral vascular disease, unspecified: Secondary | ICD-10-CM

## 2021-03-05 ENCOUNTER — Telehealth: Payer: Self-pay | Admitting: Physician Assistant

## 2021-03-05 NOTE — Telephone Encounter (Signed)
Patient calling for lab results, please assist.

## 2021-03-05 NOTE — Telephone Encounter (Signed)
Spoke with the patient. Patient sts that he had his fasting lipid repeated at his already scheduled appt with his pcp. Patient sts that he was not fasting for the labs drawn at our office but he was fasting with the labs drawn at his pcp on 02/25/21. Patient has been on the mediterranean diet and he has lost >30 lbs. Congratulated the patient.   Patient sts that he just wanted to make sure that Dr. Kirke Corin was able to review them.  Adv him that I will fwd an update to Dr. Kirke Corin as requested.

## 2021-03-07 NOTE — Telephone Encounter (Signed)
DPR on file. lmom with Dr. Arida's response and recommendation. Patient is to contact the office if any questions. 

## 2021-03-07 NOTE — Telephone Encounter (Signed)
His lipid profile looked excellent.  Continue same medications.

## 2021-03-27 ENCOUNTER — Other Ambulatory Visit: Payer: Self-pay

## 2021-03-27 ENCOUNTER — Ambulatory Visit (INDEPENDENT_AMBULATORY_CARE_PROVIDER_SITE_OTHER): Payer: Medicare Other

## 2021-03-27 DIAGNOSIS — I8312 Varicose veins of left lower extremity with inflammation: Secondary | ICD-10-CM | POA: Diagnosis not present

## 2021-03-27 DIAGNOSIS — E785 Hyperlipidemia, unspecified: Secondary | ICD-10-CM

## 2021-03-27 DIAGNOSIS — I8311 Varicose veins of right lower extremity with inflammation: Secondary | ICD-10-CM | POA: Diagnosis not present

## 2021-03-27 DIAGNOSIS — I6522 Occlusion and stenosis of left carotid artery: Secondary | ICD-10-CM | POA: Diagnosis not present

## 2021-03-27 DIAGNOSIS — I739 Peripheral vascular disease, unspecified: Secondary | ICD-10-CM | POA: Diagnosis not present

## 2021-04-22 ENCOUNTER — Other Ambulatory Visit: Payer: Self-pay | Admitting: Cardiovascular Disease

## 2021-08-05 ENCOUNTER — Other Ambulatory Visit: Payer: Self-pay

## 2021-08-05 MED ORDER — ROSUVASTATIN CALCIUM 20 MG PO TABS: 20.0000 mg | ORAL_TABLET | Freq: Every day | ORAL | 1 refills | Status: DC

## 2021-08-05 MED ORDER — METOPROLOL TARTRATE 25 MG PO TABS: 25.0000 mg | ORAL_TABLET | Freq: Two times a day (BID) | ORAL | 1 refills | Status: DC

## 2021-08-05 MED ORDER — LISINOPRIL 5 MG PO TABS: 5.0000 mg | ORAL_TABLET | Freq: Every day | ORAL | 1 refills | Status: DC

## 2022-01-29 ENCOUNTER — Other Ambulatory Visit: Payer: Self-pay | Admitting: Cardiovascular Disease

## 2022-01-29 NOTE — Telephone Encounter (Signed)
Please schedule 12 month F/U for 90 day refills. Thank you! 

## 2022-01-30 NOTE — Telephone Encounter (Signed)
LMOV  

## 2022-02-04 NOTE — Telephone Encounter (Signed)
Pt called in and made the next available appt with Dr Fletcher Anon 6/1 at 9:40.   ? ? ?Pharmacy: [facility]  ?

## 2022-04-03 ENCOUNTER — Encounter: Payer: Self-pay | Admitting: Emergency Medicine

## 2022-04-03 ENCOUNTER — Other Ambulatory Visit
Admission: RE | Admit: 2022-04-03 | Discharge: 2022-04-03 | Disposition: A | Discharge status: Home / Self Care | Payer: Medicare Other | Attending: Cardiovascular Disease | Admitting: Cardiovascular Disease

## 2022-04-03 ENCOUNTER — Encounter: Payer: Self-pay | Admitting: Cardiovascular Disease

## 2022-04-03 ENCOUNTER — Ambulatory Visit (INDEPENDENT_AMBULATORY_CARE_PROVIDER_SITE_OTHER): Payer: Medicare Other | Admitting: Cardiovascular Disease

## 2022-04-03 VITALS — BP 132/80 | HR 64 | Ht 72.5 in | Wt 217.2 lb

## 2022-04-03 DIAGNOSIS — I5032 Chronic diastolic (congestive) heart failure: Secondary | ICD-10-CM | POA: Diagnosis present

## 2022-04-03 DIAGNOSIS — E785 Hyperlipidemia, unspecified: Secondary | ICD-10-CM | POA: Insufficient documentation

## 2022-04-03 DIAGNOSIS — I2510 Atherosclerotic heart disease of native coronary artery without angina pectoris: Secondary | ICD-10-CM | POA: Diagnosis present

## 2022-04-03 DIAGNOSIS — I779 Disorder of arteries and arterioles, unspecified: Secondary | ICD-10-CM | POA: Diagnosis present

## 2022-04-03 DIAGNOSIS — Z125 Encounter for screening for malignant neoplasm of prostate: Secondary | ICD-10-CM

## 2022-04-03 LAB — LIPID PANEL
Cholesterol: 115 mg/dL (ref 0–200)
HDL: 33 mg/dL — ABNORMAL LOW (ref >40)
LDL Cholesterol: 58 mg/dL (ref 0–99)
Total CHOL/HDL Ratio: 3.5 RATIO
Triglycerides: 118 mg/dL (ref <150)
VLDL: 24 mg/dL (ref 0–40)

## 2022-04-03 LAB — COMPREHENSIVE METABOLIC PANEL
ALT: 26 U/L (ref 0–44)
AST: 21 U/L (ref 15–41)
Albumin: 4.5 g/dL (ref 3.5–5.0)
Alkaline Phosphatase: 54 U/L (ref 38–126)
Anion gap: 7 (ref 5–15)
BUN: 20 mg/dL (ref 8–23)
CO2: 28 mmol/L (ref 22–32)
Calcium: 9.7 mg/dL (ref 8.9–10.3)
Chloride: 103 mmol/L (ref 98–111)
Creatinine, Ser: 1.02 mg/dL (ref 0.61–1.24)
GFR, Estimated: >60 mL/min (ref >60)
Glucose, Bld: 106 mg/dL — ABNORMAL HIGH (ref 70–99)
Potassium: 5 mmol/L (ref 3.5–5.1)
Sodium: 138 mmol/L (ref 135–145)
Total Bilirubin: 1.1 mg/dL (ref 0.3–1.2)
Total Protein: 7.3 g/dL (ref 6.5–8.1)

## 2022-04-03 LAB — CBC
HCT: 48 % (ref 39.0–52.0)
Hemoglobin: 16.6 g/dL (ref 13.0–17.0)
MCH: 31 pg (ref 26.0–34.0)
MCHC: 34.6 g/dL (ref 30.0–36.0)
MCV: 89.6 fL (ref 80.0–100.0)
Platelets: 220 10*3/uL (ref 150–400)
RBC: 5.36 MIL/uL (ref 4.22–5.81)
RDW: 11.8 % (ref 11.5–15.5)
WBC: 7.3 10*3/uL (ref 4.0–10.5)
nRBC: 0 % (ref 0.0–0.2)

## 2022-04-03 NOTE — Progress Notes (Signed)
Results letter for patient  

## 2022-04-03 NOTE — Progress Notes (Signed)
Cardiology Office Note   Date:  04/03/2022   ID:  Gilbert THISSELL, DOB 07/08/1951, MRN [id number]  PCP:  Malva Limes, MD  Cardiologist:   Lorine Bears, MD   Chief Complaint  Patient presents with   Other    12 Month f/u no complaints today. Meds reviewed verbally with pt.      History of Present Illness: Gilbert Coleman is a 71 y.o. male who presents for a follow-up visit regarding coronary artery disease. He presented in December 2016 with inferolateral ST elevation myocardial infarction which was complicated by ventricular fibrillation requiring multiple shocks. Emergent cardiac cath showed severe thrombotic disease in the mid left circumflex with 70% diffuse proximal RCA stenosis. He underwent successful angioplasty and drug-eluting stent placement to the mid left circumflex.  Echocardiogram showed an ejection fraction of 40-45% with mild mitral regurgitation.  He had brief post MI atrial fibrillation with no evidence of recurrent arrhythmia.  He underwent a treadmill nuclear stress test in February 2017 which showed possible mild apical ischemia, normal ejection fraction, no ST deviation with exercise and frequent PVCs with exercise. There was no evidence of inferior wall ischemia. He is known to have occluded left vertebral artery with no significant obstructive disease affecting his carotid arteries.   Echocardiogram in 2021 showed normal LV systolic function with mild mitral regurgitation.  Lower extremity arterial Doppler in May 2022 showed normal ABI with moderately abnormal TBI.  He has been doing well with no recent chest pain, shortness of breath or palpitations.  He takes his medications regularly.     Past Medical History:  Diagnosis Date   CAD (coronary artery disease)    a. LM nl, LAD 68m, 20d, D1/D2 min irregs, LCX 68m (3.0x15 Xience Alpine DES), OM1/2 nl, OM3 30ost, RCA 70p/m, RPDA /RPL1/RPL2 nl, EF 45-50%-->admisison complicated by recurrent VF requiring  Defib and amio.   History of chicken pox    History of measles    Hyperlipidemia    Hypertension    Ischemic cardiomyopathy    a. 10/2015 Echo: EF 40-45%, sev inflat HK, mild MR, mildly dil LA, PASP .   Morbid obesity (HCC)    PAF (paroxysmal atrial fibrillation) (HCC)    a. At time of STEMI in setting of recurrent VF/defib/ACLS meds-->Amio.   Rectal bleeding 04/17/2016   Rectal pain 04/17/2016    Past Surgical History:  Procedure Laterality Date   CARDIAC CATHETERIZATION N/A 10/26/2015   Procedure: Left Heart Cath and Coronary Angiography;  Surgeon: Iran Ouch, MD;  Location: [facility] INVASIVE CV LAB;  Service: Cardiovascular;  Laterality: N/A;   CARDIAC CATHETERIZATION N/A 10/26/2015   Procedure: Coronary Stent Intervention;  Surgeon: Iran Ouch, MD;  Location: [facility] INVASIVE CV LAB;  Service: Cardiovascular;  Laterality: N/A;   CATARACT EXTRACTION Bilateral    colon polyps     COLONOSCOPY  06/21/2014   Diminutive polyps. Tubular ademona, Hyperplastic polyps. Repeat 06/2019   COLONOSCOPY WITH PROPOFOL N/A 02/13/2020   Procedure: COLONOSCOPY WITH PROPOFOL;  Surgeon: Toledo, Boykin Nearing, MD;  Location: [facility] ENDOSCOPY;  Service: Gastroenterology;  Laterality: N/A;   CORONARY ANGIOPLASTY     stent/2016   FINGER SURGERY       Current Outpatient Medications  Medication Sig Dispense Refill   aspirin 81 MG tablet Take 1 tablet by mouth daily.     cholecalciferol (VITAMIN D) 25 MCG (1000 UNIT) tablet Take 2,000 Units by mouth daily.     docusate sodium (COLACE) 100 MG  capsule Take 100 mg by mouth 2 (two) times daily.     latanoprost (XALATAN) 0.005 % ophthalmic solution Place 1 drop into both eyes at bedtime.     lisinopril (ZESTRIL) 5 MG tablet TAKE ONE TABLET BY MOUTH DAILY 90 tablet 0   metoprolol tartrate (LOPRESSOR) 25 MG tablet TAKE ONE TABLET BY MOUTH TWICE A DAY 180 tablet 0   Misc Natural Products (GLUCOSAMINE CHONDROITIN COMPLX) TABS Take 1 tablet by mouth daily.      Multiple Vitamin (MULTIVITAMIN WITH MINERALS) TABS tablet Take 1 tablet by mouth daily.     nitroGLYCERIN (NITROSTAT) 0.4 MG SL tablet Place 1 tablet (0.4 mg total) under the tongue every 5 (five) minutes as needed for chest pain. 25 tablet 3   Omega-3 Fatty Acids (FISH OIL) 1000 MG CAPS Take 2 capsules (2,000 mg total) by mouth daily. 60 capsule 0   psyllium (METAMUCIL) 58.6 % powder Take 1 packet by mouth daily.     rosuvastatin (CRESTOR) 20 MG tablet TAKE ONE TABLET BY MOUTH DAILY 90 tablet 0   saw palmetto 500 MG capsule Take 500 mg by mouth daily.     vitamin B-12 (CYANOCOBALAMIN) 1000 MCG tablet Take 1,000 mcg by mouth daily.     No current facility-administered medications for this visit.    Allergies:   Atorvastatin and Ciprofloxacin    Social History:  The patient  reports that he quit smoking about 12 years ago. His smoking use included cigars. He has a 7.50 pack-year smoking history. He has never used smokeless tobacco. He reports current alcohol use. He reports that he does not use drugs.   Family History:  The patient's family history includes Alzheimer's disease in his mother; Heart disease in his brother, brother, and father.    ROS:  Please see the history of present illness.   Otherwise, review of systems are positive for none.   All other systems are reviewed and negative.    PHYSICAL EXAM: VS:  BP 132/80 (BP Location: Left Arm, Patient Position: Sitting, Cuff Size: Normal)   Pulse 64   Ht 6' 0.5" (1.842 m)   Wt 217 lb 4 oz (98.5 kg)   SpO2 98%   BMI 29.06 kg/m  , BMI Body mass index is 29.06 kg/m. GEN: Well nourished, well developed, in no acute distress  HEENT: normal  Neck: no JVD, carotid bruits, or masses Cardiac: RRR; no murmurs, rubs, or gallops,no edema  Respiratory:  clear to auscultation bilaterally, normal work of breathing GI: soft, nontender, nondistended, + BS MS: no deformity or atrophy  Skin: warm and dry, no rash Neuro:  Strength and  sensation are intact Psych: euthymic mood, full affect   EKG:  EKG is ordered today. The ekg ordered today demonstrates normal sinus rhythm with no significant ST or T wave changes.   Recent Labs: No results found for requested labs within last 8760 hours.    Lipid Panel    Component Value Date/Time   CHOL 115 02/25/2021 1433   TRIG 133 02/25/2021 1433   HDL 33 (L) 02/25/2021 1433   CHOLHDL 3.5 02/25/2021 1433   CHOLHDL 3.9 01/02/2020 1016   VLDL 37 01/02/2020 1016   LDLCALC 58 02/25/2021 1433      Wt Readings from Last 3 Encounters:  04/03/22 217 lb 4 oz (98.5 kg)  02/25/21 226 lb 9.6 oz (102.8 kg)  02/11/21 229 lb (103.9 kg)      Other studies Reviewed:    ASSESSMENT AND PLAN:  1.  Coronary artery disease involving native coronary arteries without angina: He is doing very well from a cardiac standpoint with no anginal symptoms.  Continue aspirin 81 mg once daily.  2.  Mixed hyperlipidemia: He has known history of elevated LDL and triglyceride.  I reviewed most recent lipid profile done last year which showed an LDL of 58 and triglyceride of 133.  Continue rosuvastatin 100 mg once daily.  I requested a follow-up lipid and liver profile.  3.  Previous ischemic cardiomyopathy: Most recent ejection fraction improved to normal.  Continue small dose lisinopril and metoprolol.    4. Left vertebral artery occlusion: currently asymptomatic.  Most recent carotid Doppler showed mild nonobstructive carotid disease and antegrade flow in bilateral vertebral artery.   I requested routine labs on him.   Disposition:   FU with me in 12 months  Signed,  Kathlyn Sacramento, MD  04/03/2022 10:11 AM    [place]

## 2022-04-03 NOTE — Patient Instructions (Addendum)
Medication Instructions:   Your physician recommends that you continue on your current medications as directed. Please refer to the Current Medication list given to you today.  *If you need a refill on your cardiac medications before your next appointment, please call your pharmacy*   Lab Work:  Today at the medical mall: CBC, CMP, Lipid panel  -  Please go to the Medical Mall Entrance at [facility] -  Check in at the Registration Desk: 1st desk to the right, past the screening table  If you have labs (blood work) drawn today and your tests are completely normal, you will receive your results only by: MyChart Message (if you have MyChart) OR A paper copy in the mail If you have any lab test that is abnormal or we need to change your treatment, we will call you to review the results.   Testing/Procedures:  None ordered   Follow-Up: At [facility], you and your health needs are our priority.  As part of our continuing mission to provide you with exceptional heart care, we have created designated Provider Care Teams.  These Care Teams include your primary Cardiologist (physician) and Advanced Practice Providers (APPs -  Physician Assistants and Nurse Practitioners) who all work together to provide you with the care you need, when you need it.  We recommend signing up for the patient portal called "MyChart".  Sign up information is provided on this After Visit Summary.  MyChart is used to connect with patients for Virtual Visits (Telemedicine).  Patients are able to view lab/test results, encounter notes, upcoming appointments, etc.  Non-urgent messages can be sent to your provider as well.   To learn more about what you can do with MyChart, go to [email or web address].    Your next appointment:   1 year(s)  The format for your next appointment:   In Person  Provider:   You may see Lorine Bears, MD or one of the following Advanced Practice Providers on your designated Care Team:    Nicolasa Ducking, NP Eula Listen, PA-C Cadence Fransico Michael, PA-C{   Important Information About Sugar

## 2022-04-28 ENCOUNTER — Other Ambulatory Visit: Payer: Self-pay | Admitting: Cardiovascular Disease

## 2023-01-29 ENCOUNTER — Telehealth: Payer: Self-pay | Admitting: Cardiovascular Disease

## 2023-01-29 MED ORDER — METOPROLOL TARTRATE 25 MG PO TABS: 25.0000 mg | ORAL_TABLET | Freq: Two times a day (BID) | ORAL | 0 refills | Status: DC

## 2023-01-29 MED ORDER — ROSUVASTATIN CALCIUM 20 MG PO TABS: 20.0000 mg | ORAL_TABLET | Freq: Every day | ORAL | 0 refills | Status: DC

## 2023-01-29 MED ORDER — LISINOPRIL 5 MG PO TABS: 5.0000 mg | ORAL_TABLET | Freq: Every day | ORAL | 0 refills | Status: DC

## 2023-01-29 NOTE — Telephone Encounter (Signed)
Requested Prescriptions   Signed Prescriptions Disp Refills   lisinopril (ZESTRIL) 5 MG tablet 90 tablet 0    Sig: Take 1 tablet (5 mg total) by mouth daily.    Authorizing Provider: Kathlyn Sacramento A    Ordering User: Raelene Bott, Arnold Depinto L   metoprolol tartrate (LOPRESSOR) 25 MG tablet 180 tablet 0    Sig: Take 1 tablet (25 mg total) by mouth 2 (two) times daily.    Authorizing Provider: Kathlyn Sacramento A    Ordering User: Raelene Bott, Saidi Santacroce L   rosuvastatin (CRESTOR) 20 MG tablet 90 tablet 0    Sig: Take 1 tablet (20 mg total) by mouth daily.    Authorizing Provider: Kathlyn Sacramento A    Ordering User: Raelene Bott, Zahari Fazzino L

## 2023-01-29 NOTE — Telephone Encounter (Signed)
*  STAT* If patient is at the pharmacy, call can be transferred to refill team.   1. Which medications need to be refilled? (please list name of each medication and dose if known)  lisinopril (ZESTRIL) 5 MG tablet  metoprolol tartrate (LOPRESSOR) 25 MG tablet   rosuvastatin (CRESTOR) 20 MG tablet     2. Which pharmacy/location (including street and city if local pharmacy) is medication to be sent to? [facility] [id number] - [place] [address], [place]   3. Do they need a 30 day or 90 day supply? 90 day  Patient is completely out of medication

## 2023-04-20 ENCOUNTER — Telehealth: Payer: Self-pay | Admitting: Cardiovascular Disease

## 2023-04-20 ENCOUNTER — Other Ambulatory Visit: Payer: Self-pay | Admitting: Cardiovascular Disease

## 2023-04-20 MED ORDER — LISINOPRIL 5 MG PO TABS: 5.0000 mg | ORAL_TABLET | Freq: Every day | ORAL | 0 refills | Status: DC

## 2023-04-20 MED ORDER — METOPROLOL TARTRATE 25 MG PO TABS: 25.0000 mg | ORAL_TABLET | Freq: Two times a day (BID) | ORAL | 0 refills | Status: DC

## 2023-04-20 MED ORDER — ROSUVASTATIN CALCIUM 20 MG PO TABS: 20.0000 mg | ORAL_TABLET | Freq: Every day | ORAL | 0 refills | Status: DC

## 2023-04-20 NOTE — Telephone Encounter (Signed)
*  STAT* If patient is at the pharmacy, call can be transferred to refill team.   1. Which medications need to be refilled? (please list name of each medication and dose if known)   metoprolol tartrate (LOPRESSOR) 25 MG tablet  rosuvastatin (CRESTOR) 20 MG tablet  lisinopril (ZESTRIL) 5 MG tablet   2. Which pharmacy/location (including street and city if local pharmacy) is medication to be sent to?   [facility] [id number] - [place], [place] - [address]    3. Do they need a 30 day or 90 day supply?   90

## 2023-04-20 NOTE — Telephone Encounter (Signed)
Appt on 07/02/23  Requested Prescriptions   Signed Prescriptions Disp Refills   lisinopril (ZESTRIL) 5 MG tablet 90 tablet 0    Sig: Take 1 tablet (5 mg total) by mouth daily.    Authorizing Provider: Lorine Bears A    Ordering User: Feliberto Harts L   metoprolol tartrate (LOPRESSOR) 25 MG tablet 180 tablet 0    Sig: Take 1 tablet (25 mg total) by mouth 2 (two) times daily.    Authorizing Provider: Lorine Bears A    Ordering User: Feliberto Harts L   rosuvastatin (CRESTOR) 20 MG tablet 90 tablet 0    Sig: Take 1 tablet (20 mg total) by mouth daily.    Authorizing Provider: Lorine Bears A    Ordering User: Guerry Minors

## 2023-04-20 NOTE — Telephone Encounter (Signed)
Hi,  Could you schedule this patient a 12 month follow up visit? The patient was last seen by Dr. Kirke Corin on 04-03-22. Thank you so much.

## 2023-04-25 ENCOUNTER — Other Ambulatory Visit: Payer: Self-pay | Admitting: Cardiovascular Disease

## 2023-04-30 NOTE — Telephone Encounter (Signed)
PATIENT IS SCHEDULED 8/29

## 2023-07-02 ENCOUNTER — Encounter: Payer: Self-pay | Admitting: Cardiovascular Disease

## 2023-07-02 ENCOUNTER — Ambulatory Visit: Payer: Medicare Other | Attending: Cardiovascular Disease | Admitting: Cardiovascular Disease

## 2023-07-02 VITALS — BP 126/70 | HR 66 | Ht 78.0 in | Wt 226.4 lb

## 2023-07-02 DIAGNOSIS — I2510 Atherosclerotic heart disease of native coronary artery without angina pectoris: Secondary | ICD-10-CM | POA: Diagnosis present

## 2023-07-02 DIAGNOSIS — E785 Hyperlipidemia, unspecified: Secondary | ICD-10-CM

## 2023-07-02 DIAGNOSIS — I255 Ischemic cardiomyopathy: Secondary | ICD-10-CM

## 2023-07-02 NOTE — Progress Notes (Signed)
Cardiology Office Note   Date:  07/02/2023   ID:  Gilbert Coleman, DOB January 07, 1951, MRN [id number]  PCP:  Malva Limes, MD  Cardiologist:   Lorine Bears, MD   Chief Complaint  Patient presents with   Follow-up    Patient denies new or acute cardiac problems/concerns today.        History of Present Illness: Gilbert Coleman is a 72 y.o. male who presents for a follow-up visit regarding coronary artery disease. He presented in December 2016 with inferolateral ST elevation myocardial infarction which was complicated by ventricular fibrillation requiring multiple shocks. Emergent cardiac cath showed severe thrombotic disease in the mid left circumflex with 70% diffuse proximal RCA stenosis. He underwent successful angioplasty and drug-eluting stent placement to the mid left circumflex.  Echocardiogram showed an ejection fraction of 40-45% with mild mitral regurgitation.  He had brief post MI atrial fibrillation with no evidence of recurrent arrhythmia.  He underwent a treadmill nuclear stress test in February 2017 which showed possible mild apical ischemia, normal ejection fraction, no ST deviation with exercise and frequent PVCs with exercise. There was no evidence of inferior wall ischemia. He is known to have occluded left vertebral artery with no significant obstructive disease affecting his carotid arteries.   Echocardiogram in 2021 showed normal LV systolic function with mild mitral regurgitation.  Lower extremity arterial Doppler in May 2022 showed normal ABI with moderately abnormal TBI.  He has been doing well with no chest pain, shortness of breath or palpitations.  He takes his medications regularly.  He does his yard work with no exertional symptoms.     Past Medical History:  Diagnosis Date   CAD (coronary artery disease)    a. LM nl, LAD 80m, 20d, D1/D2 min irregs, LCX 55m (3.0x15 Xience Alpine DES), OM1/2 nl, OM3 30ost, RCA 70p/m, RPDA /RPL1/RPL2 nl, EF  45-50%-->admisison complicated by recurrent VF requiring Defib and amio.   History of chicken pox    History of measles    Hyperlipidemia    Hypertension    Ischemic cardiomyopathy    a. 10/2015 Echo: EF 40-45%, sev inflat HK, mild MR, mildly dil LA, PASP .   Morbid obesity (HCC)    PAF (paroxysmal atrial fibrillation) (HCC)    a. At time of STEMI in setting of recurrent VF/defib/ACLS meds-->Amio.   Rectal bleeding 04/17/2016   Rectal pain 04/17/2016    Past Surgical History:  Procedure Laterality Date   CARDIAC CATHETERIZATION N/A 10/26/2015   Procedure: Left Heart Cath and Coronary Angiography;  Surgeon: Iran Ouch, MD;  Location: [facility] INVASIVE CV LAB;  Service: Cardiovascular;  Laterality: N/A;   CARDIAC CATHETERIZATION N/A 10/26/2015   Procedure: Coronary Stent Intervention;  Surgeon: Iran Ouch, MD;  Location: [facility] INVASIVE CV LAB;  Service: Cardiovascular;  Laterality: N/A;   CATARACT EXTRACTION Bilateral    colon polyps     COLONOSCOPY  06/21/2014   Diminutive polyps. Tubular ademona, Hyperplastic polyps. Repeat 06/2019   COLONOSCOPY WITH PROPOFOL N/A 02/13/2020   Procedure: COLONOSCOPY WITH PROPOFOL;  Surgeon: Toledo, Boykin Nearing, MD;  Location: [facility] ENDOSCOPY;  Service: Gastroenterology;  Laterality: N/A;   CORONARY ANGIOPLASTY     stent/2016   FINGER SURGERY       Current Outpatient Medications  Medication Sig Dispense Refill   aspirin 81 MG tablet Take 1 tablet by mouth daily.     cholecalciferol (VITAMIN D) 25 MCG (1000 UNIT) tablet Take 2,000 Units by mouth daily.  docusate sodium (COLACE) 100 MG capsule Take 100 mg by mouth 2 (two) times daily.     latanoprost (XALATAN) 0.005 % ophthalmic solution Place 1 drop into both eyes at bedtime.     lisinopril (ZESTRIL) 5 MG tablet Take 1 tablet (5 mg total) by mouth daily. 90 tablet 0   metoprolol tartrate (LOPRESSOR) 25 MG tablet Take 1 tablet (25 mg total) by mouth 2 (two) times daily. 180 tablet 0    Misc Natural Products (GLUCOSAMINE CHONDROITIN COMPLX) TABS Take 1 tablet by mouth daily.     Multiple Vitamin (MULTIVITAMIN WITH MINERALS) TABS tablet Take 1 tablet by mouth daily.     nitroGLYCERIN (NITROSTAT) 0.4 MG SL tablet Place 1 tablet (0.4 mg total) under the tongue every 5 (five) minutes as needed for chest pain. 25 tablet 3   Omega-3 Fatty Acids (FISH OIL) 1000 MG CAPS Take 2 capsules (2,000 mg total) by mouth daily. 60 capsule 0   psyllium (METAMUCIL) 58.6 % powder Take 1 packet by mouth daily.     rosuvastatin (CRESTOR) 20 MG tablet Take 1 tablet (20 mg total) by mouth daily. 90 tablet 0   saw palmetto 500 MG capsule Take 500 mg by mouth daily.     vitamin B-12 (CYANOCOBALAMIN) 1000 MCG tablet Take 1,000 mcg by mouth daily.     No current facility-administered medications for this visit.    Allergies:   Atorvastatin and Ciprofloxacin    Social History:  The patient  reports that he quit smoking about 13 years ago. His smoking use included cigars. He started smoking about 43 years ago. He has a 7.5 pack-year smoking history. He has never used smokeless tobacco. He reports current alcohol use. He reports that he does not use drugs.   Family History:  The patient's family history includes Alzheimer's disease in his mother; Heart disease in his brother, brother, and father.    ROS:  Please see the history of present illness.   Otherwise, review of systems are positive for none.   All other systems are reviewed and negative.    PHYSICAL EXAM: VS:  BP 126/70 (BP Location: Left Arm, Patient Position: Sitting, Cuff Size: Normal)   Pulse 66   Ht 6\' 6"  (1.981 m)   Wt 226 lb 6.4 oz (102.7 kg)   SpO2 94%   BMI 26.16 kg/m  , BMI Body mass index is 26.16 kg/m. GEN: Well nourished, well developed, in no acute distress  HEENT: normal  Neck: no JVD, carotid bruits, or masses Cardiac: RRR; no murmurs, rubs, or gallops,no edema  Respiratory:  clear to auscultation bilaterally, normal  work of breathing GI: soft, nontender, nondistended, + BS MS: no deformity or atrophy  Skin: warm and dry, no rash Neuro:  Strength and sensation are intact Psych: euthymic mood, full affect   EKG:  EKG is ordered today. The ekg ordered today demonstrates : Normal sinus rhythm Inferior infarct , age undetermined     Recent Labs: No results found for requested labs within last 365 days.    Lipid Panel    Component Value Date/Time   CHOL 115 04/03/2022 1035   CHOL 115 02/25/2021 1433   TRIG 118 04/03/2022 1035   HDL 33 (L) 04/03/2022 1035   HDL 33 (L) 02/25/2021 1433   CHOLHDL 3.5 04/03/2022 1035   VLDL 24 04/03/2022 1035   LDLCALC 58 04/03/2022 1035   LDLCALC 58 02/25/2021 1433      Wt Readings from Last 3 Encounters:  07/02/23 226 lb 6.4 oz (102.7 kg)  04/03/22 217 lb 4 oz (98.5 kg)  02/25/21 226 lb 9.6 oz (102.8 kg)      Other studies Reviewed:    ASSESSMENT AND PLAN:  1.  Coronary artery disease involving native coronary arteries without angina: He is doing very well from a cardiac standpoint with no anginal symptoms.  Continue aspirin 81 mg once daily.  2.  Mixed hyperlipidemia: He has known history of elevated LDL and triglyceride.   Continue treatment with rosuvastatin and fish oil.  I requested lipid and liver profile today.  He is not fasting today so his triglyceride might be a little bit high but his LDL should not be affected.  3.  Previous ischemic cardiomyopathy: Most recent ejection fraction improved to normal.  Continue small dose lisinopril and metoprolol.  I request basic metabolic profile.  4. Left vertebral artery occlusion: currently asymptomatic.  Most recent carotid Doppler showed mild nonobstructive carotid disease and antegrade flow in bilateral vertebral artery.     Disposition:   FU with me in 12 months  Signed,  Lorine Bears, MD  07/02/2023 3:49 PM    [facility]

## 2023-07-02 NOTE — Patient Instructions (Signed)
Medication Instructions:  Your Physician recommend you continue on your current medication as directed.    *If you need a refill on your cardiac medications before your next appointment, please call your pharmacy*   Lab Work: Your provider would like for you to have following labs drawn today CBC, CMP, Lipid Profile.   If you have labs (blood work) drawn today and your tests are completely normal, you will receive your results only by: MyChart Message (if you have MyChart) OR A paper copy in the mail If you have any lab test that is abnormal or we need to change your treatment, we will call you to review the results.   Testing/Procedures: None   Follow-Up: At [facility], you and your health needs are our priority.  As part of our continuing mission to provide you with exceptional heart care, we have created designated Provider Care Teams.  These Care Teams include your primary Cardiologist (physician) and Advanced Practice Providers (APPs -  Physician Assistants and Nurse Practitioners) who all work together to provide you with the care you need, when you need it.  We recommend signing up for the patient portal called "MyChart".  Sign up information is provided on this After Visit Summary.  MyChart is used to connect with patients for Virtual Visits (Telemedicine).  Patients are able to view lab/test results, encounter notes, upcoming appointments, etc.  Non-urgent messages can be sent to your provider as well.   To learn more about what you can do with MyChart, go to [email or web address].    Your next appointment:   1 year(s)  Provider:   You may see Lorine Bears, MD or one of the following Advanced Practice Providers on your designated Care Team:

## 2023-07-03 ENCOUNTER — Encounter: Payer: Self-pay | Admitting: *Deleted

## 2023-07-03 LAB — COMPREHENSIVE METABOLIC PANEL
ALT: 24 IU/L (ref 0–44)
AST: 19 IU/L (ref 0–40)
Albumin: 4.3 g/dL (ref 3.9–4.9)
Alkaline Phosphatase: 75 IU/L (ref 44–121)
BUN/Creatinine Ratio: 22 (ref 10–24)
BUN: 21 mg/dL (ref 8–27)
Bilirubin Total: 0.4 mg/dL (ref 0.0–1.2)
CO2: 23 mmol/L (ref 20–29)
Calcium: 9.3 mg/dL (ref 8.6–10.2)
Chloride: 103 mmol/L (ref 96–106)
Creatinine, Ser: 0.97 mg/dL (ref 0.76–1.27)
Globulin, Total: 2.1 g/dL (ref 1.5–4.5)
Glucose: 125 mg/dL — ABNORMAL HIGH (ref 70–99)
Potassium: 4.1 mmol/L (ref 3.5–5.2)
Sodium: 140 mmol/L (ref 134–144)
Total Protein: 6.4 g/dL (ref 6.0–8.5)
eGFR: 84 mL/min/{1.73_m2} (ref >59)

## 2023-07-03 LAB — LIPID PANEL
Chol/HDL Ratio: 4.8 ratio (ref 0.0–5.0)
Cholesterol, Total: 139 mg/dL (ref 100–199)
HDL: 29 mg/dL — ABNORMAL LOW (ref >39)
LDL Chol Calc (NIH): 57 mg/dL (ref 0–99)
Triglycerides: 343 mg/dL — ABNORMAL HIGH (ref 0–149)
VLDL Cholesterol Cal: 53 mg/dL — ABNORMAL HIGH (ref 5–40)

## 2023-07-03 LAB — CBC
Hematocrit: 46 % (ref 37.5–51.0)
Hemoglobin: 15.8 g/dL (ref 13.0–17.7)
MCH: 32 pg (ref 26.6–33.0)
MCHC: 34.3 g/dL (ref 31.5–35.7)
MCV: 93 fL (ref 79–97)
Platelets: 220 10*3/uL (ref 150–450)
RBC: 4.93 x10E6/uL (ref 4.14–5.80)
RDW: 12.4 % (ref 11.6–15.4)
WBC: 7.8 10*3/uL (ref 3.4–10.8)

## 2023-07-16 ENCOUNTER — Other Ambulatory Visit: Payer: Self-pay | Admitting: Cardiovascular Disease

## 2023-12-30 ENCOUNTER — Ambulatory Visit: Payer: Medicare Other | Admitting: Emergency Medicine

## 2023-12-30 ENCOUNTER — Encounter: Payer: Self-pay | Admitting: Family Medicine

## 2023-12-30 ENCOUNTER — Ambulatory Visit (INDEPENDENT_AMBULATORY_CARE_PROVIDER_SITE_OTHER): Payer: Medicare Other | Admitting: Family Medicine

## 2023-12-30 VITALS — Ht 72.0 in | Wt 210.0 lb

## 2023-12-30 VITALS — BP 131/65 | HR 61 | Ht 72.0 in | Wt 210.0 lb

## 2023-12-30 DIAGNOSIS — Z0000 Encounter for general adult medical examination without abnormal findings: Secondary | ICD-10-CM | POA: Diagnosis not present

## 2023-12-30 DIAGNOSIS — E781 Pure hyperglyceridemia: Secondary | ICD-10-CM

## 2023-12-30 DIAGNOSIS — R739 Hyperglycemia, unspecified: Secondary | ICD-10-CM

## 2023-12-30 DIAGNOSIS — I779 Disorder of arteries and arterioles, unspecified: Secondary | ICD-10-CM

## 2023-12-30 DIAGNOSIS — Z23 Encounter for immunization: Secondary | ICD-10-CM | POA: Diagnosis not present

## 2023-12-30 DIAGNOSIS — L989 Disorder of the skin and subcutaneous tissue, unspecified: Secondary | ICD-10-CM | POA: Diagnosis not present

## 2023-12-30 DIAGNOSIS — E785 Hyperlipidemia, unspecified: Secondary | ICD-10-CM | POA: Diagnosis not present

## 2023-12-30 DIAGNOSIS — I255 Ischemic cardiomyopathy: Secondary | ICD-10-CM

## 2023-12-30 DIAGNOSIS — Z125 Encounter for screening for malignant neoplasm of prostate: Secondary | ICD-10-CM

## 2023-12-30 NOTE — Patient Instructions (Signed)
Please review the attached list of medications and notify my office if there are any errors.   The CDC recommends two doses of Shingrix (the shingles vaccine) separated by 2 to 6 months for adults age 71 years and older. I recommend checking with your insurance plan regarding coverage for this vaccine.    You are due for a Tdap (tetanus-diptheria-pertussis vaccine) which protects you from tetanus and whooping cough. Please check with your insurance plan or pharmacy regarding coverage for this vaccine.

## 2023-12-30 NOTE — Progress Notes (Signed)
 Established patient visit   Patient: Gilbert Coleman   DOB: 30-Jan-1951   73 y.o. Male  MRN: [id number] Visit Date: 12/30/2023  Today's healthcare provider: Mila Merry, MD   Chief Complaint  Patient presents with   Hypertension   Hyperlipidemia   Atrial Fibrillation   Subjective    Discussed the use of AI scribe software for clinical note transcription with the patient, who gave verbal consent to proceed.  The patient presents for follow up hypertension and lipids. He would also like to have PSA checked.  He has a history of high blood sugar and high triglycerides, with the last blood work in August showing elevated levels. He has been trying to lose weight, which he believes will help with his triglyceride levels.  He has a history of cardiac issues and sees a cardiologist annually. He has been hospitalized six times in the past for heart-related issues but has been doing well since then.  He received a flu shot today and has previously received the pneumonia vaccine. He is due for a tetanus and shingles vaccine.  He has noticed a bump on his chest that came off in the shower about six to eight months ago, which has since grown back and looks different. He does not have a dermatologist and is concerned about the bump.   Medications: Outpatient Medications Prior to Visit  Medication Sig   aspirin 81 MG tablet Take 1 tablet by mouth daily.   cholecalciferol (VITAMIN D) 25 MCG (1000 UNIT) tablet Take 2,000 Units by mouth daily.   docusate sodium (COLACE) 100 MG capsule Take 100 mg by mouth 2 (two) times daily.   latanoprost (XALATAN) 0.005 % ophthalmic solution Place 1 drop into both eyes at bedtime.   lisinopril (ZESTRIL) 5 MG tablet TAKE 1 TABLET BY MOUTH DAILY   metoprolol tartrate (LOPRESSOR) 25 MG tablet TAKE 1 TABLET BY MOUTH 2 TIMES A DAY   Misc Natural Products (GLUCOSAMINE CHONDROITIN COMPLX) TABS Take 1 tablet by mouth daily.   Multiple Vitamin (MULTIVITAMIN WITH  MINERALS) TABS tablet Take 1 tablet by mouth daily.   nitroGLYCERIN (NITROSTAT) 0.4 MG SL tablet Place 1 tablet (0.4 mg total) under the tongue every 5 (five) minutes as needed for chest pain. (Patient not taking: Reported on 12/30/2023)   Omega-3 Fatty Acids (FISH OIL) 1000 MG CAPS Take 2 capsules (2,000 mg total) by mouth daily.   psyllium (METAMUCIL) 58.6 % powder Take 1 packet by mouth daily.   rosuvastatin (CRESTOR) 20 MG tablet TAKE 1 TABLET BY MOUTH DAILY   saw palmetto 500 MG capsule Take 500 mg by mouth daily.   vitamin B-12 (CYANOCOBALAMIN) 1000 MCG tablet Take 1,000 mcg by mouth daily.   No facility-administered medications prior to visit.   Review of Systems  Constitutional:  Negative for appetite change, chills and fever.  Respiratory:  Negative for chest tightness, shortness of breath and wheezing.   Cardiovascular:  Negative for chest pain and palpitations.  Gastrointestinal:  Negative for abdominal pain, nausea and vomiting.       Objective    BP 131/65 (BP Location: Left Arm, Patient Position: Sitting, Cuff Size: Large)   Pulse 61   Ht 6' (1.829 m)   Wt 210 lb (95.3 kg)   SpO2 98%   BMI 28.48 kg/m   Physical Exam   General: Appearance:    Well developed, well nourished male in no acute distress  Eyes:    PERRL, conjunctiva/corneas clear, EOM's  intact       Lungs:     Clear to auscultation bilaterally, respirations unlabored  Heart:    Normal heart rate. RRR. No murmurs, rubs, or gallops.    Skin:   Scattered Sks, cherry hemangiomas and skin tags across torso. Cutaneous horn noted on mid chest.   MS:   All extremities are intact.    Neurologic:   Awake, alert, oriented x 3. No apparent focal neurological defect.         Assessment & Plan     1. Prostate cancer screening (Primary)  - PSA Total (Reflex To Free)  2. Skin lesion of chest wall  - Ambulatory referral to Dermatology  3. Hypertriglyceridemia  - Comprehensive metabolic panel - Lipid  panel  4. Hyperlipidemia, unspecified hyperlipidemia type He is tolerating rosuvastatin well with no adverse effects.    5. Carotid artery disease, unspecified laterality, unspecified type (HCC) Asymptomatic. Compliant with medication.  Continue aggressive risk factor modification.    6. Ischemic cardiomyopathy Continue regular follow up with cardiology.   7. Immunization due  - Pneumococcal conjugate vaccine 20-valent (Prevnar 20)  Recommended Prevnar and Shingrix vaccines.   8. Hyperglycemia  - Hemoglobin A1c      Mila Merry, MD  [facility] [phone] (phone) [phone] (fax)  [facility]

## 2023-12-30 NOTE — Progress Notes (Signed)
 Subjective:   Gilbert Coleman is a 73 y.o. who presents for a Medicare Wellness preventive visit.  Visit Complete: Virtual I connected with  Dia Crawford on 12/30/23 by a audio enabled telemedicine application and verified that I am speaking with the correct person using two identifiers.  Patient Location: Home  Provider Location: Home Office  I discussed the limitations of evaluation and management by telemedicine. The patient expressed understanding and agreed to proceed.  Vital Signs: Because this visit was a virtual/telehealth visit, some criteria may be missing or patient reported. Any vitals not documented were not able to be obtained and vitals that have been documented are patient reported.  VideoDeclined- This patient declined Librarian, academic. Therefore the visit was completed with audio only.  AWV Questionnaire: No: Patient Medicare AWV questionnaire was not completed prior to this visit.  Cardiac Risk Factors include: advanced age (>58men, >30 women);male gender;dyslipidemia;Other (see comment), Risk factor comments: CAD     Objective:    Today's Vitals   12/30/23 0851  Weight: 210 lb (95.3 kg)  Height: 6' (1.829 m)  PainSc: 2    Body mass index is 28.48 kg/m.     12/30/2023    9:02 AM 01/02/2021   10:39 AM 02/13/2020   12:43 PM 10/26/2015    6:26 PM  Advanced Directives  Does Patient Have a Medical Advance Directive? No No No No  Would patient like information on creating a medical advance directive? No - Patient declined No - Patient declined No - Patient declined No - patient declined information    Current Medications (verified) Outpatient Encounter Medications as of 12/30/2023  Medication Sig   aspirin 81 MG tablet Take 1 tablet by mouth daily.   cholecalciferol (VITAMIN D) 25 MCG (1000 UNIT) tablet Take 2,000 Units by mouth daily.   docusate sodium (COLACE) 100 MG capsule Take 100 mg by mouth 2 (two) times daily.    latanoprost (XALATAN) 0.005 % ophthalmic solution Place 1 drop into both eyes at bedtime.   lisinopril (ZESTRIL) 5 MG tablet TAKE 1 TABLET BY MOUTH DAILY   metoprolol tartrate (LOPRESSOR) 25 MG tablet TAKE 1 TABLET BY MOUTH 2 TIMES A DAY   Misc Natural Products (GLUCOSAMINE CHONDROITIN COMPLX) TABS Take 1 tablet by mouth daily.   Multiple Vitamin (MULTIVITAMIN WITH MINERALS) TABS tablet Take 1 tablet by mouth daily.   Omega-3 Fatty Acids (FISH OIL) 1000 MG CAPS Take 2 capsules (2,000 mg total) by mouth daily.   psyllium (METAMUCIL) 58.6 % powder Take 1 packet by mouth daily.   rosuvastatin (CRESTOR) 20 MG tablet TAKE 1 TABLET BY MOUTH DAILY   saw palmetto 500 MG capsule Take 500 mg by mouth daily.   vitamin B-12 (CYANOCOBALAMIN) 1000 MCG tablet Take 1,000 mcg by mouth daily.   nitroGLYCERIN (NITROSTAT) 0.4 MG SL tablet Place 1 tablet (0.4 mg total) under the tongue every 5 (five) minutes as needed for chest pain. (Patient not taking: Reported on 12/30/2023)   No facility-administered encounter medications on file as of 12/30/2023.    Allergies (verified) Atorvastatin and Ciprofloxacin   History: Past Medical History:  Diagnosis Date   CAD (coronary artery disease)    a. LM nl, LAD 59m, 20d, D1/D2 min irregs, LCX 77m (3.0x15 Xience Alpine DES), OM1/2 nl, OM3 30ost, RCA 70p/m, RPDA /RPL1/RPL2 nl, EF 45-50%-->admisison complicated by recurrent VF requiring Defib and amio.   History of chicken pox    History of measles    Hyperlipidemia  Hypertension    Ischemic cardiomyopathy    a. 10/2015 Echo: EF 40-45%, sev inflat HK, mild MR, mildly dil LA, PASP .   Morbid obesity (HCC)    PAF (paroxysmal atrial fibrillation) (HCC)    a. At time of STEMI in setting of recurrent VF/defib/ACLS meds-->Amio.   Rectal bleeding 04/17/2016   Rectal pain 04/17/2016   Past Surgical History:  Procedure Laterality Date   CARDIAC CATHETERIZATION N/A 10/26/2015   Procedure: Left Heart Cath and Coronary  Angiography;  Surgeon: Iran Ouch, MD;  Location: [facility] INVASIVE CV LAB;  Service: Cardiovascular;  Laterality: N/A;   CARDIAC CATHETERIZATION N/A 10/26/2015   Procedure: Coronary Stent Intervention;  Surgeon: Iran Ouch, MD;  Location: [facility] INVASIVE CV LAB;  Service: Cardiovascular;  Laterality: N/A;   CATARACT EXTRACTION Bilateral    colon polyps     COLONOSCOPY  06/21/2014   Diminutive polyps. Tubular ademona, Hyperplastic polyps. Repeat 06/2019   COLONOSCOPY WITH PROPOFOL N/A 02/13/2020   Procedure: COLONOSCOPY WITH PROPOFOL;  Surgeon: Toledo, Boykin Nearing, MD;  Location: [facility] ENDOSCOPY;  Service: Gastroenterology;  Laterality: N/A;   CORONARY ANGIOPLASTY     stent/2016   FINGER SURGERY     Family History  Problem Relation Age of Onset   Heart disease Father    Heart disease Brother    Heart disease Brother    Alzheimer's disease Mother    Social History   Socioeconomic History   Marital status: Married    Spouse name: Meriam Sprague   Number of children: 1   Years of education: Not on file   Highest education level: Associate degree: occupational, Scientist, product/process development, or vocational program  Occupational History   Occupation: retired  Tobacco Use   Smoking status: Former    Current packs/day: 0.00    Average packs/day: 0.3 packs/day for 30.0 years (7.5 ttl pk-yrs)    Types: Cigars, Cigarettes    Start date: 11/04/1979    Quit date: 11/03/2009    Years since quitting: 14.1   Smokeless tobacco: Never   Tobacco comments:    chews nicorette  Vaping Use   Vaping status: Never Used  Substance and Sexual Activity   Alcohol use: Yes    Comment: 1 drink monthly or less   Drug use: No   Sexual activity: Not on file  Other Topics Concern   Not on file  Social History Narrative   Not on file   Social Drivers of Health   Financial Resource Strain: Low Risk  (12/30/2023)   Overall Financial Resource Strain (CARDIA)    Difficulty of Paying Living Expenses: Not hard at all  Food  Insecurity: No Food Insecurity (12/30/2023)   Hunger Vital Sign    Worried About Running Out of Food in the Last Year: Never true    Ran Out of Food in the Last Year: Never true  Transportation Needs: No Transportation Needs (12/30/2023)   PRAPARE - Administrator, Civil Service (Medical): No    Lack of Transportation (Non-Medical): No  Physical Activity: Insufficiently Active (12/30/2023)   Exercise Vital Sign    Days of Exercise per Week: 3 days    Minutes of Exercise per Session: 30 min  Stress: No Stress Concern Present (12/30/2023)   [employer] of Occupational Health - Occupational Stress Questionnaire    Feeling of Stress : Not at all  Social Connections: Moderately Integrated (12/30/2023)   Social Connection and Isolation Panel [NHANES]    Frequency of Communication with Friends and  Family: Three times a week    Frequency of Social Gatherings with Friends and Family: More than three times a week    Attends Religious Services: Never    Database administrator or Organizations: Yes    Attends Engineer, structural: More than 4 times per year    Marital Status: Married    Tobacco Counseling Counseling given: Not Answered Tobacco comments: chews nicorette    Clinical Intake:  Pre-visit preparation completed: Yes  Pain : 0-10 Pain Score: 2  Pain Type: Chronic pain Pain Location: Wrist Pain Orientation: Left Pain Descriptors / Indicators: Aching     BMI - recorded: 28.48 Nutritional Status: BMI 25 -29 Overweight Nutritional Risks: None Diabetes: No  How often do you need to have someone help you when you read instructions, pamphlets, or other written materials from your doctor or pharmacy?: 1 - Never  Interpreter Needed?: No  Information entered by :: Tora Kindred, CMA   Activities of Daily Living     12/30/2023    8:53 AM  In your present state of health, do you have any difficulty performing the following activities:  Hearing? 0   Vision? 0  Difficulty concentrating or making decisions? 0  Walking or climbing stairs? 0  Dressing or bathing? 0  Doing errands, shopping? 0  Preparing Food and eating ? N  Using the Toilet? N  In the past six months, have you accidently leaked urine? N  Do you have problems with loss of bowel control? N  Managing your Medications? N  Managing your Finances? N  Housekeeping or managing your Housekeeping? N    Patient Care Team: Malva Limes, MD as PCP - General (Family Medicine) Iran Ouch, MD as PCP - Cardiology (Cardiology) Stegmayer, Ranae Plumber, PA-C (Inactive) as Physician Assistant (Vascular Surgery) Stanton Kidney, MD as Consulting Physician (Gastroenterology) System, Provider Not In [place], [place] (Inactive) (Cardiology) Marlene Bast [facility])  Indicate any recent Medical Services you may have received from other than [facility] providers in the past year (date may be approximate).     Assessment:   This is a routine wellness examination for Christop.  Hearing/Vision screen Hearing Screening - Comments:: Denies hearing loss Vision Screening - Comments:: Gets routine eye exams, [facility]   Goals Addressed             This Visit's Progress    Exercise 150 min/wk Moderate Activity         Depression Screen     12/30/2023    9:01 AM 02/25/2021    1:41 PM 12/05/2020    3:41 PM  PHQ 2/9 Scores  PHQ - 2 Score 0 0 0  PHQ- 9 Score  0 0    Fall Risk     12/30/2023    9:04 AM 01/02/2021   10:39 AM 12/05/2020    3:41 PM  Fall Risk   Falls in the past year? 0 0 0  Number falls in past yr: 0 0 0  Injury with Fall? 0 0 0  Risk for fall due to : No Fall Risks  No Fall Risks  Follow up Falls prevention discussed;Falls evaluation completed  Falls evaluation completed    MEDICARE RISK AT HOME:  Medicare Risk at Home Any stairs in or around the home?: Yes If so, are there any without handrails?: No Home free of loose throw rugs in  walkways, pet beds, electrical cords, etc?: Yes Adequate lighting in your home  to reduce risk of falls?: Yes Life alert?: No Use of a cane, walker or w/c?: No Grab bars in the bathroom?: No Shower chair or bench in shower?: No Elevated toilet seat or a handicapped toilet?: No  TIMED UP AND GO:  Was the test performed?  No  Cognitive Function: 6CIT completed        12/30/2023    9:05 AM  6CIT Screen  What Year? 0 points  What month? 0 points  What time? 0 points  Count back from 20 0 points  Months in reverse 2 points  Repeat phrase 0 points  Total Score 2 points    Immunizations Immunization History  Administered Date(s) Administered   Influenza, High Dose Seasonal PF 08/03/2020   Influenza-Unspecified 07/05/2023   PFIZER(Purple Top)SARS-COV-2 Vaccination 03/09/2020, 03/30/2020   Pneumococcal Polysaccharide-23 02/25/2021   Tdap 04/24/2011   Zoster, Live 09/20/2014    Screening Tests Health Maintenance  Topic Date Due   Zoster Vaccines- Shingrix (1 of 2) 12/13/1971   COVID-19 Vaccine (3 - Pfizer risk series) 04/27/2020   DTaP/Tdap/Td (2 - Td or Tdap) 04/23/2021   Pneumonia Vaccine 23+ Years old (2 of 2 - PCV) 02/25/2022   Medicare Annual Wellness (AWV)  12/29/2024   Colonoscopy  02/12/2025   INFLUENZA VACCINE  Completed   Hepatitis C Screening  Completed   HPV VACCINES  Aged Out    Health Maintenance  Health Maintenance Due  Topic Date Due   Zoster Vaccines- Shingrix (1 of 2) 12/13/1971   COVID-19 Vaccine (3 - Pfizer risk series) 04/27/2020   DTaP/Tdap/Td (2 - Td or Tdap) 04/23/2021   Pneumonia Vaccine 25+ Years old (2 of 2 - PCV) 02/25/2022   Health Maintenance Items Addressed: See Nurse Notes  Additional Screening:  Vision Screening: Recommended annual ophthalmology exams for early detection of glaucoma and other disorders of the eye.  Dental Screening: Recommended annual dental exams for proper oral hygiene  Community Resource Referral / Chronic  Care Management: CRR required this visit?  No   CCM required this visit?  No     Plan:     I have personally reviewed and noted the following in the patient's chart:   Medical and social history Use of alcohol, tobacco or illicit drugs  Current medications and supplements including opioid prescriptions. Patient is not currently taking opioid prescriptions. Functional ability and status Nutritional status Physical activity Advanced directives List of other physicians Hospitalizations, surgeries, and ER visits in previous 12 months Vitals Screenings to include cognitive, depression, and falls Referrals and appointments  In addition, I have reviewed and discussed with patient certain preventive protocols, quality metrics, and best practice recommendations. A written personalized care plan for preventive services as well as general preventive health recommendations were provided to patient.     Tora Kindred, CMA   12/30/2023   After Visit Summary: (Pick Up) Due to this being a telephonic visit, with patients personalized plan was offered to patient and patient has requested to Pick up at office.  Notes:  6 CIT Score - 2 Needs Tdap, shingrix and pneumonia vaccines Declined Covid vaccine

## 2023-12-30 NOTE — Patient Instructions (Addendum)
 Gilbert Coleman , Thank you for taking time to come for your Medicare Wellness Visit. I appreciate your ongoing commitment to your health goals. Please review the following plan we discussed and let me know if I can assist you in the future.   Referrals/Orders/Follow-Ups/Clinician Recommendations: Get the tetanus, shingles and pneumonia vaccines at your convenience  This is a list of the screening recommended for you and due dates:  Health Maintenance  Topic Date Due   Zoster (Shingles) Vaccine (1 of 2) 12/13/1971   COVID-19 Vaccine (3 - Pfizer risk series) 04/27/2020   DTaP/Tdap/Td vaccine (2 - Td or Tdap) 04/23/2021   Pneumonia Vaccine (2 of 2 - PCV) 02/25/2022   Medicare Annual Wellness Visit  12/29/2024   Colon Cancer Screening  02/12/2025   Flu Shot  Completed   Hepatitis C Screening  Completed   HPV Vaccine  Aged Out    Advanced directives: (Declined) Advance directive discussed with you today. Even though you declined this today, please call our office should you change your mind, and we can give you the proper paperwork for you to fill out.  Next Medicare Annual Wellness Visit scheduled for next year: Yes, 01/04/25 @ 8:50am (phone visit)

## 2023-12-31 LAB — LIPID PANEL
Chol/HDL Ratio: 4.1 {ratio} (ref 0.0–5.0)
Cholesterol, Total: 138 mg/dL (ref 100–199)
HDL: 34 mg/dL — ABNORMAL LOW (ref >39)
LDL Chol Calc (NIH): 74 mg/dL (ref 0–99)
Triglycerides: 176 mg/dL — ABNORMAL HIGH (ref 0–149)
VLDL Cholesterol Cal: 30 mg/dL (ref 5–40)

## 2023-12-31 LAB — HEMOGLOBIN A1C
Est. average glucose Bld gHb Est-mCnc: 114 mg/dL
Hgb A1c MFr Bld: 5.6 % (ref 4.8–5.6)

## 2023-12-31 LAB — COMPREHENSIVE METABOLIC PANEL
ALT: 25 [IU]/L (ref 0–44)
AST: 21 [IU]/L (ref 0–40)
Albumin: 4.6 g/dL (ref 3.8–4.8)
Alkaline Phosphatase: 67 [IU]/L (ref 44–121)
BUN/Creatinine Ratio: 18 (ref 10–24)
BUN: 18 mg/dL (ref 8–27)
Bilirubin Total: 0.7 mg/dL (ref 0.0–1.2)
CO2: 24 mmol/L (ref 20–29)
Calcium: 10 mg/dL (ref 8.6–10.2)
Chloride: 102 mmol/L (ref 96–106)
Creatinine, Ser: 1.01 mg/dL (ref 0.76–1.27)
Globulin, Total: 2.5 g/dL (ref 1.5–4.5)
Glucose: 107 mg/dL — ABNORMAL HIGH (ref 70–99)
Potassium: 4.7 mmol/L (ref 3.5–5.2)
Sodium: 142 mmol/L (ref 134–144)
Total Protein: 7.1 g/dL (ref 6.0–8.5)
eGFR: 80 mL/min/{1.73_m2} (ref >59)

## 2023-12-31 LAB — PSA TOTAL (REFLEX TO FREE): Prostate Specific Ag, Serum: 1 ng/mL (ref 0.0–4.0)

## 2024-07-11 ENCOUNTER — Other Ambulatory Visit: Payer: Self-pay | Admitting: Cardiovascular Disease

## 2024-07-15 ENCOUNTER — Other Ambulatory Visit: Payer: Self-pay | Admitting: Cardiovascular Disease

## 2024-08-04 ENCOUNTER — Ambulatory Visit: Payer: PRIVATE HEALTH INSURANCE | Attending: Cardiovascular Disease | Admitting: Cardiovascular Disease

## 2024-08-04 VITALS — BP 120/64 | HR 56 | Ht 72.0 in | Wt 220.0 lb

## 2024-08-04 DIAGNOSIS — I255 Ischemic cardiomyopathy: Secondary | ICD-10-CM | POA: Insufficient documentation

## 2024-08-04 DIAGNOSIS — E785 Hyperlipidemia, unspecified: Secondary | ICD-10-CM | POA: Diagnosis not present

## 2024-08-04 DIAGNOSIS — I2510 Atherosclerotic heart disease of native coronary artery without angina pectoris: Secondary | ICD-10-CM | POA: Insufficient documentation

## 2024-08-04 MED ORDER — METOPROLOL TARTRATE 25 MG PO TABS: 25.0000 mg | ORAL_TABLET | Freq: Two times a day (BID) | ORAL | 3 refills | Status: DC

## 2024-08-04 MED ORDER — LISINOPRIL 5 MG PO TABS: 5.0000 mg | ORAL_TABLET | Freq: Every day | ORAL | 3 refills | Status: AC

## 2024-08-04 MED ORDER — ROSUVASTATIN CALCIUM 20 MG PO TABS: 20.0000 mg | ORAL_TABLET | Freq: Every day | ORAL | 3 refills | Status: DC

## 2024-08-04 MED ORDER — LISINOPRIL 5 MG PO TABS: 5.0000 mg | ORAL_TABLET | Freq: Every day | ORAL | 3 refills | Status: DC

## 2024-08-04 MED ORDER — METOPROLOL TARTRATE 25 MG PO TABS: 25.0000 mg | ORAL_TABLET | Freq: Two times a day (BID) | ORAL | 3 refills | Status: AC

## 2024-08-04 MED ORDER — ROSUVASTATIN CALCIUM 20 MG PO TABS: 20.0000 mg | ORAL_TABLET | Freq: Every day | ORAL | 3 refills | Status: AC

## 2024-08-04 NOTE — Patient Instructions (Signed)
 Medication Instructions:  No changes *If you need a refill on your cardiac medications before your next appointment, please call your pharmacy*  Lab Work: Your provider would like for you to have the following labs today: Lipid  If you have labs (blood work) drawn today and your tests are completely normal, you will receive your results only by: MyChart Message (if you have MyChart) OR A paper copy in the mail If you have any lab test that is abnormal or we need to change your treatment, we will call you to review the results.  Testing/Procedures: None ordered  Follow-Up: At [facility], you and your health needs are our priority.  As part of our continuing mission to provide you with exceptional heart care, our providers are all part of one team.  This team includes your primary Cardiologist (physician) and Advanced Practice Providers or APPs (Physician Assistants and Nurse Practitioners) who all work together to provide you with the care you need, when you need it.  Your next appointment:   12 month(s)  Provider:   You may see Deatrice Cage, MD or one of the following Advanced Practice Providers on your designated Care Team:   Lonni Meager, NP Lesley Maffucci, PA-C Bernardino Bring, PA-C Cadence Mogadore, PA-C Tylene Lunch, NP Barnie Hila, NP    We recommend signing up for the patient portal called MyChart.  Sign up information is provided on this After Visit Summary.  MyChart is used to connect with patients for Virtual Visits (Telemedicine).  Patients are able to view lab/test results, encounter notes, upcoming appointments, etc.  Non-urgent messages can be sent to your provider as well.   To learn more about what you can do with MyChart, go to [email or web address].

## 2024-08-04 NOTE — Progress Notes (Signed)
 Cardiology Office Note   Date:  08/04/2024   ID:  Gilbert Coleman, DOB 06/12/1951, MRN [id number]  PCP:  Gasper Nancyann BRAVO, MD  Cardiologist:   Deatrice Cage, MD   Chief Complaint  Patient presents with   Follow-up    OD 12 month f/u no complaints today. Meds reviewed verbally with pt.      History of Present Illness: Gilbert Coleman is a 73 y.o. male who presents for a follow-up visit regarding coronary artery disease. He presented in December 2016 with inferolateral ST elevation myocardial infarction which was complicated by ventricular fibrillation requiring multiple shocks. Emergent cardiac cath showed severe thrombotic disease in the mid left circumflex with 70% diffuse proximal RCA stenosis. He underwent successful angioplasty and drug-eluting stent placement to the mid left circumflex.  Echocardiogram showed an ejection fraction of 40-45% with mild mitral regurgitation.  He had brief post MI atrial fibrillation with no evidence of recurrent arrhythmia.  He underwent a treadmill nuclear stress test in February 2017 which showed possible mild apical ischemia, normal ejection fraction, no ST deviation with exercise and frequent PVCs with exercise. There was no evidence of inferior wall ischemia. He is known to have occluded left vertebral artery with no significant obstructive disease affecting his carotid arteries.   Echocardiogram in 2021 showed normal LV systolic function with mild mitral regurgitation.  Lower extremity arterial Doppler in May 2022 showed normal ABI with moderately abnormal TBI.  He has been doing extremely well with no chest pain, shortness of breath or palpitations.  He takes his medications regularly.    Past Medical History:  Diagnosis Date   CAD (coronary artery disease)    a. LM nl, LAD 53m, 20d, D1/D2 min irregs, LCX 61m (3.0x15 Xience Alpine DES), OM1/2 nl, OM3 30ost, RCA 70p/m, RPDA /RPL1/RPL2 nl, EF 45-50%-->admisison complicated by recurrent VF  requiring Defib and amio.   History of chicken pox    History of measles    Hyperlipidemia    Hypertension    Ischemic cardiomyopathy    a. 10/2015 Echo: EF 40-45%, sev inflat HK, mild MR, mildly dil LA, PASP .   Morbid obesity (HCC)    PAF (paroxysmal atrial fibrillation) (HCC)    a. At time of STEMI in setting of recurrent VF/defib/ACLS meds-->Amio.   Rectal bleeding 04/17/2016   Rectal pain 04/17/2016    Past Surgical History:  Procedure Laterality Date   CARDIAC CATHETERIZATION N/A 10/26/2015   Procedure: Left Heart Cath and Coronary Angiography;  Surgeon: Deatrice DELENA Cage, MD;  Location: [facility] INVASIVE CV LAB;  Service: Cardiovascular;  Laterality: N/A;   CARDIAC CATHETERIZATION N/A 10/26/2015   Procedure: Coronary Stent Intervention;  Surgeon: Deatrice DELENA Cage, MD;  Location: [facility] INVASIVE CV LAB;  Service: Cardiovascular;  Laterality: N/A;   CATARACT EXTRACTION Bilateral    colon polyps     COLONOSCOPY  06/21/2014   Diminutive polyps. Tubular ademona, Hyperplastic polyps. Repeat 06/2019   COLONOSCOPY WITH PROPOFOL  N/A 02/13/2020   Procedure: COLONOSCOPY WITH PROPOFOL ;  Surgeon: Toledo, Ladell POUR, MD;  Location: [facility] ENDOSCOPY;  Service: Gastroenterology;  Laterality: N/A;   CORONARY ANGIOPLASTY     stent/2016   FINGER SURGERY       Current Outpatient Medications  Medication Sig Dispense Refill   aspirin  81 MG tablet Take 1 tablet by mouth daily.     cholecalciferol (VITAMIN D) 25 MCG (1000 UNIT) tablet Take 2,000 Units by mouth daily.     docusate sodium (COLACE) 100 MG  capsule Take 100 mg by mouth 2 (two) times daily. (Patient taking differently: Take 100 mg by mouth daily.)     latanoprost (XALATAN) 0.005 % ophthalmic solution Place 1 drop into both eyes at bedtime.     Misc Natural Products (GLUCOSAMINE CHONDROITIN COMPLX) TABS Take 1 tablet by mouth daily.     Multiple Vitamin (MULTIVITAMIN WITH MINERALS) TABS tablet Take 1 tablet by mouth daily.     nitroGLYCERIN   (NITROSTAT ) 0.4 MG SL tablet Place 1 tablet (0.4 mg total) under the tongue every 5 (five) minutes as needed for chest pain. 25 tablet 3   Omega-3 Fatty Acids (FISH OIL ) 1000 MG CAPS Take 2 capsules (2,000 mg total) by mouth daily. 60 capsule 0   psyllium (METAMUCIL) 58.6 % powder Take 1 packet by mouth daily.     saw palmetto 500 MG capsule Take 500 mg by mouth daily. (Patient taking differently: Take 1,000 mg by mouth daily.)     vitamin B-12 (CYANOCOBALAMIN) 1000 MCG tablet Take 1,000 mcg by mouth daily.     No current facility-administered medications for this visit.    Allergies:   Atorvastatin and Ciprofloxacin     Social History:  The patient  reports that he quit smoking about 14 years ago. His smoking use included cigars and cigarettes. He started smoking about 44 years ago. He has a 7.5 pack-year smoking history. He has never used smokeless tobacco. He reports current alcohol use. He reports that he does not use drugs.   Family History:  The patient's family history includes Alzheimer's disease in his mother; Heart disease in his brother, brother, and father.    ROS:  Please see the history of present illness.   Otherwise, review of systems are positive for none.   All other systems are reviewed and negative.    PHYSICAL EXAM: VS:  BP 120/64 (BP Location: Left Arm, Patient Position: Sitting, Cuff Size: Large)   Pulse (!) 56   Ht 6' (1.829 m)   Wt 220 lb (99.8 kg)   SpO2 98%   BMI 29.84 kg/m  , BMI Body mass index is 29.84 kg/m. GEN: Well nourished, well developed, in no acute distress  HEENT: normal  Neck: no JVD, carotid bruits, or masses Cardiac: RRR; no murmurs, rubs, or gallops,no edema  Respiratory:  clear to auscultation bilaterally, normal work of breathing GI: soft, nontender, nondistended, + BS MS: no deformity or atrophy  Skin: warm and dry, no rash Neuro:  Strength and sensation are intact Psych: euthymic mood, full affect   EKG:  EKG is ordered  today. The ekg ordered today demonstrates : Sinus bradycardia When compared with ECG of 02-Jul-2023 15:28, QRS duration has increased       Recent Labs: 12/30/2023: ALT 25; BUN 18; Creatinine, Ser 1.01; Potassium 4.7; Sodium 142    Lipid Panel    Component Value Date/Time   CHOL 138 12/30/2023 1101   TRIG 176 (H) 12/30/2023 1101   HDL 34 (L) 12/30/2023 1101   CHOLHDL 4.1 12/30/2023 1101   CHOLHDL 3.5 04/03/2022 1035   VLDL 24 04/03/2022 1035   LDLCALC 74 12/30/2023 1101      Wt Readings from Last 3 Encounters:  08/04/24 220 lb (99.8 kg)  12/30/23 210 lb (95.3 kg)  12/30/23 210 lb (95.3 kg)      Other studies Reviewed:    ASSESSMENT AND PLAN:  1.  Coronary artery disease involving native coronary arteries without angina: He is doing very well from a cardiac  standpoint with no anginal symptoms.  Continue aspirin  81 mg once daily.  2.  Mixed hyperlipidemia: He has known history of elevated LDL and triglyceride.   Continue treatment with rosuvastatin  and fish oil .  I requested fasting lipid profile today.  His most recent LDL was 74 and we might have to consider adding ezetimibe to get his LDL below 55.  3.  Previous ischemic cardiomyopathy: Most recent ejection fraction improved to normal.  Continue small dose lisinopril  and metoprolol .  Both of these medications were refilled today.  4. Left vertebral artery occlusion: currently asymptomatic.  Most recent carotid Doppler showed mild nonobstructive carotid disease and antegrade flow in bilateral vertebral artery.     Disposition:   FU with me in 12 months  Signed,  Deatrice Cage, MD  08/04/2024 10:04 AM    [facility]

## 2024-08-05 LAB — LIPID PANEL
Chol/HDL Ratio: 3.9 ratio (ref 0.0–5.0)
Cholesterol, Total: 125 mg/dL (ref 100–199)
HDL: 32 mg/dL — ABNORMAL LOW (ref >39)
LDL Chol Calc (NIH): 65 mg/dL (ref 0–99)
Triglycerides: 163 mg/dL — ABNORMAL HIGH (ref 0–149)
VLDL Cholesterol Cal: 28 mg/dL (ref 5–40)

## 2024-08-10 ENCOUNTER — Ambulatory Visit: Payer: Self-pay | Admitting: Cardiovascular Disease

## 2025-01-04 ENCOUNTER — Ambulatory Visit: Payer: PRIVATE HEALTH INSURANCE
